# Patient Record
Sex: Female | Born: 1970 | Race: Black or African American | Hispanic: No | Marital: Single | Smoking: Never smoker
Health system: Southern US, Community
[De-identification: ages and names within clinical notes are randomized; demographics above are authoritative.]

## PROBLEM LIST (undated history)

## (undated) DIAGNOSIS — N949 Unspecified condition associated with female genital organs and menstrual cycle: Secondary | ICD-10-CM

## (undated) DIAGNOSIS — D649 Anemia, unspecified: Secondary | ICD-10-CM

## (undated) DIAGNOSIS — N859 Noninflammatory disorder of uterus, unspecified: Secondary | ICD-10-CM

## (undated) DIAGNOSIS — J45909 Unspecified asthma, uncomplicated: Secondary | ICD-10-CM

## (undated) HISTORY — PX: GALLBLADDER SURGERY: SHX652

## (undated) HISTORY — PX: ABDOMINAL HYSTERECTOMY: SHX81

## (undated) HISTORY — PX: CHOLECYSTECTOMY: SHX55

---

## 2012-12-12 DIAGNOSIS — M6281 Muscle weakness (generalized): Secondary | ICD-10-CM

## 2014-03-05 ENCOUNTER — Emergency Department (HOSPITAL_COMMUNITY): Payer: Self-pay

## 2014-03-05 ENCOUNTER — Encounter (HOSPITAL_COMMUNITY): Payer: Self-pay | Admitting: Emergency Medicine

## 2014-03-05 ENCOUNTER — Emergency Department (HOSPITAL_COMMUNITY)
Admission: EM | Admit: 2014-03-05 | Discharge: 2014-03-05 | Disposition: A | Discharge status: Home / Self Care | Payer: Self-pay | Attending: Emergency Medicine | Admitting: Emergency Medicine

## 2014-03-05 DIAGNOSIS — J45901 Unspecified asthma with (acute) exacerbation: Secondary | ICD-10-CM | POA: Insufficient documentation

## 2014-03-05 DIAGNOSIS — Z79899 Other long term (current) drug therapy: Secondary | ICD-10-CM | POA: Insufficient documentation

## 2014-03-05 DIAGNOSIS — Z862 Personal history of diseases of the blood and blood-forming organs and certain disorders involving the immune mechanism: Secondary | ICD-10-CM | POA: Insufficient documentation

## 2014-03-05 DIAGNOSIS — Z8742 Personal history of other diseases of the female genital tract: Secondary | ICD-10-CM | POA: Insufficient documentation

## 2014-03-05 HISTORY — DX: Unspecified condition associated with female genital organs and menstrual cycle: N94.9

## 2014-03-05 HISTORY — DX: Noninflammatory disorder of uterus, unspecified: N85.9

## 2014-03-05 HISTORY — DX: Unspecified asthma, uncomplicated: J45.909

## 2014-03-05 HISTORY — DX: Anemia, unspecified: D64.9

## 2014-03-05 MED ORDER — PREDNISONE 20 MG PO TABS: 60.0000 mg | ORAL_TABLET | Freq: Every day | ORAL | Status: DC

## 2014-03-05 MED ORDER — IPRATROPIUM-ALBUTEROL 0.5-2.5 (3) MG/3ML IN SOLN
3.0000 mL | Freq: Once | RESPIRATORY_TRACT | Status: AC
  Administered 2014-03-05: 3 mL via RESPIRATORY_TRACT
  Filled 2014-03-05: qty 3

## 2014-03-05 MED ORDER — ALBUTEROL SULFATE HFA 108 (90 BASE) MCG/ACT IN AERS
2.0000 | INHALATION_SPRAY | RESPIRATORY_TRACT | Status: DC | PRN
  Filled 2014-03-05: qty 6.7

## 2014-03-05 MED ORDER — PREDNISONE 50 MG PO TABS
60.0000 mg | ORAL_TABLET | Freq: Once | ORAL | Status: AC
  Administered 2014-03-05: 60 mg via ORAL
  Filled 2014-03-05 (×2): qty 1

## 2014-03-05 NOTE — Discharge Instructions (Signed)
Asthma °Asthma is a recurring condition in which the airways tighten and narrow. Asthma can make it difficult to breathe. It can cause coughing, wheezing, and shortness of breath. Asthma episodes, also called asthma attacks, range from minor to life-threatening. Asthma cannot be cured, but medicines and lifestyle changes can help control it. °CAUSES °Asthma is believed to be caused by inherited (genetic) and environmental factors, but its exact cause is unknown. Asthma may be triggered by allergens, lung infections, or irritants in the air. Asthma triggers are different for each person. Common triggers include:  °· Animal dander. °· Dust mites. °· Cockroaches. °· Pollen from trees or grass. °· Mold. °· Smoke. °· Air pollutants such as dust, household cleaners, hair sprays, aerosol sprays, paint fumes, strong chemicals, or strong odors. °· Cold air, weather changes, and winds (which increase molds and pollens in the air). °· Strong emotional expressions such as crying or laughing hard. °· Stress. °· Certain medicines (such as aspirin) or types of drugs (such as beta-blockers). °· Sulfites in foods and drinks. Foods and drinks that may contain sulfites include dried fruit, potato chips, and sparkling grape juice. °· Infections or inflammatory conditions such as the flu, a cold, or an inflammation of the nasal membranes (rhinitis). °· Gastroesophageal reflux disease (GERD). °· Exercise or strenuous activity. °SYMPTOMS °Symptoms may occur immediately after asthma is triggered or many hours later. Symptoms include: °· Wheezing. °· Excessive nighttime or early morning coughing. °· Frequent or severe coughing with a common cold. °· Chest tightness. °· Shortness of breath. °DIAGNOSIS  °The diagnosis of asthma is made by a review of your medical history and a physical exam. Tests may also be performed. These may include: °· Lung function studies. These tests show how much air you breathe in and out. °· Allergy  tests. °· Imaging tests such as X-rays. °TREATMENT  °Asthma cannot be cured, but it can usually be controlled. Treatment involves identifying and avoiding your asthma triggers. It also involves medicines. There are 2 classes of medicine used for asthma treatment:  °· Controller medicines. These prevent asthma symptoms from occurring. They are usually taken every day. °· Reliever or rescue medicines. These quickly relieve asthma symptoms. They are used as needed and provide short-term relief. °Your health care provider will help you create an asthma action plan. An asthma action plan is a written plan for managing and treating your asthma attacks. It includes a list of your asthma triggers and how they may be avoided. It also includes information on when medicines should be taken and when their dosage should be changed. An action plan may also involve the use of a device called a peak flow meter. A peak flow meter measures how well the lungs are working. It helps you monitor your condition. °HOME CARE INSTRUCTIONS  °· Take medicines only as directed by your health care provider. Speak with your health care provider if you have questions about how or when to take the medicines. °· Use a peak flow meter as directed by your health care provider. Record and keep track of readings. °· Understand and use the action plan to help minimize or stop an asthma attack without needing to seek medical care. °· Control your home environment in the following ways to help prevent asthma attacks: °¨ Do not smoke. Avoid being exposed to secondhand smoke. °¨ Change your heating and air conditioning filter regularly. °¨ Limit your use of fireplaces and wood stoves. °¨ Get rid of pests (such as roaches and   mice) and their droppings.  Throw away plants if you see mold on them.  Clean your floors and dust regularly. Use unscented cleaning products.  Try to have someone else vacuum for you regularly. Stay out of rooms while they are  being vacuumed and for a short while afterward. If you vacuum, use a dust mask from a hardware store, a double-layered or microfilter vacuum cleaner bag, or a vacuum cleaner with a HEPA filter.  Replace carpet with wood, tile, or vinyl flooring. Carpet can trap dander and dust.  Use allergy-proof pillows, mattress covers, and box spring covers.  Wash bed sheets and blankets every week in hot water and dry them in a dryer.  Use blankets that are made of polyester or cotton.  Clean bathrooms and kitchens with bleach. If possible, have someone repaint the walls in these rooms with mold-resistant paint. Keep out of the rooms that are being cleaned and painted.  Wash hands frequently. SEEK MEDICAL CARE IF:   You have wheezing, shortness of breath, or a cough even if taking medicine to prevent attacks.  The colored mucus you cough up (sputum) is thicker than usual.  Your sputum changes from clear or white to yellow, green, gray, or bloody.  You have any problems that may be related to the medicines you are taking (such as a rash, itching, swelling, or trouble breathing).  You are using a reliever medicine more than 2-3 times per week.  Your peak flow is still at 50-79% of your personal best after following your action plan for 1 hour.  You have a fever. SEEK IMMEDIATE MEDICAL CARE IF:   You seem to be getting worse and are unresponsive to treatment during an asthma attack.  You are short of breath even at rest.  You get short of breath when doing very little physical activity.  You have difficulty eating, drinking, or talking due to asthma symptoms.  You develop chest pain.  You develop a fast heartbeat.  You have a bluish color to your lips or fingernails.  You are light-headed, dizzy, or faint.  Your peak flow is less than 50% of your personal best. MAKE SURE YOU:   Understand these instructions.  Will watch your condition.  Will get help right away if you are not  doing well or get worse. Document Released: 06/24/2005 Document Revised: 11/08/2013 Document Reviewed: 01/21/2013 [facility] Patient Information 2015 [place], [place]. This information is not intended to replace advice given to you by your health care provider. Make sure you discuss any questions you have with your health care provider.  Albuterol inhalation aerosol What is this medicine? ALBUTEROL (al Normajean [place]) is a bronchodilator. It helps open up the airways in your lungs to make it easier to breathe. This medicine is used to treat and to prevent bronchospasm. This medicine may be used for other purposes; ask your health care provider or pharmacist if you have questions. COMMON BRAND NAME(S): Proair HFA, Proventil, Proventil HFA, Respirol, Ventolin, Ventolin HFA What should I tell my health care provider before I take this medicine? They need to know if you have any of the following conditions: -diabetes -heart disease or irregular heartbeat -high blood pressure -pheochromocytoma -seizures -thyroid disease -an unusual or allergic reaction to albuterol, levalbuterol, sulfites, other medicines, foods, dyes, or preservatives -pregnant or trying to get pregnant -breast-feeding How should I use this medicine? This medicine is for inhalation through the mouth. Follow the directions on your prescription label. Take your medicine at  regular intervals. Do not use more often than directed. Make sure that you are using your inhaler correctly. Ask you doctor or health care provider if you have any questions. Talk to your pediatrician regarding the use of this medicine in children. Special care may be needed. Overdosage: If you think you have taken too much of this medicine contact a poison control center or emergency room at once. NOTE: This medicine is only for you. Do not share this medicine with others. What if I miss a dose? If you miss a dose, use it as soon as you can. If it is almost time  for your next dose, use only that dose. Do not use double or extra doses. What may interact with this medicine? -anti-infectives like chloroquine and pentamidine -caffeine -cisapride -diuretics -medicines for colds -medicines for depression or for emotional or psychotic conditions -medicines for weight loss including some herbal products -methadone -some antibiotics like clarithromycin, erythromycin, levofloxacin, and linezolid -some heart medicines -steroid hormones like dexamethasone, cortisone, hydrocortisone -theophylline -thyroid hormones This list may not describe all possible interactions. Give your health care provider a list of all the medicines, herbs, non-prescription drugs, or dietary supplements you use. Also tell them if you smoke, drink alcohol, or use illegal drugs. Some items may interact with your medicine. What should I watch for while using this medicine? Tell your doctor or health care professional if your symptoms do not improve. Do not use extra albuterol. If your asthma or bronchitis gets worse while you are using this medicine, call your doctor right away. If your mouth gets dry try chewing sugarless gum or sucking hard candy. Drink water as directed. What side effects may I notice from receiving this medicine? Side effects that you should report to your doctor or health care professional as soon as possible: -allergic reactions like skin rash, itching or hives, swelling of the face, lips, or tongue -breathing problems -chest pain -feeling faint or lightheaded, falls -high blood pressure -irregular heartbeat -fever -muscle cramps or weakness -pain, tingling, numbness in the hands or feet -vomiting Side effects that usually do not require medical attention (report to your doctor or health care professional if they continue or are bothersome): -cough -difficulty sleeping -headache -nervousness or trembling -stomach upset -stuffy or runny nose -throat  irritation -unusual taste This list may not describe all possible side effects. Call your doctor for medical advice about side effects. You may report side effects to FDA at 1-800-FDA-1088. Where should I keep my medicine? Keep out of the reach of children. Store at room temperature between 15 and 30 degrees C (59 and 86 degrees F). The contents are under pressure and may burst when exposed to heat or flame. Do not freeze. This medicine does not work as well if it is too cold. Throw away any unused medicine after the expiration date. Inhalers need to be thrown away after the labeled number of puffs have been used or by the expiration date; whichever comes first. Ventolin HFA should be thrown away 12 months after removing from foil pouch. Check the instructions that come with your medicine. NOTE: This sheet is a summary. It may not cover all possible information. If you have questions about this medicine, talk to your doctor, pharmacist, or health care provider.  2015, Elsevier/Gold Standard. (2012-12-10 10:57:17)  Prednisone tablets What is this medicine? PREDNISONE (PRED ni sone) is a corticosteroid. It is commonly used to treat inflammation of the skin, joints, lungs, and other organs. Common conditions treated  include asthma, allergies, and arthritis. It is also used for other conditions, such as blood disorders and diseases of the adrenal glands. This medicine may be used for other purposes; ask your health care provider or pharmacist if you have questions. COMMON BRAND NAME(S): Deltasone, Predone, Sterapred, Sterapred DS What should I tell my health care provider before I take this medicine? They need to know if you have any of these conditions: -Cushing's syndrome -diabetes -glaucoma -heart disease -high blood pressure -infection (especially a virus infection such as chickenpox, cold sores, or herpes) -kidney disease -liver disease -mental illness -myasthenia  gravis -osteoporosis -seizures -stomach or intestine problems -thyroid disease -an unusual or allergic reaction to lactose, prednisone, other medicines, foods, dyes, or preservatives -pregnant or trying to get pregnant -breast-feeding How should I use this medicine? Take this medicine by mouth with a glass of water. Follow the directions on the prescription label. Take this medicine with food. If you are taking this medicine once a day, take it in the morning. Do not take more medicine than you are told to take. Do not suddenly stop taking your medicine because you may develop a severe reaction. Your doctor will tell you how much medicine to take. If your doctor wants you to stop the medicine, the dose may be slowly lowered over time to avoid any side effects. Talk to your pediatrician regarding the use of this medicine in children. Special care may be needed. Overdosage: If you think you have taken too much of this medicine contact a poison control center or emergency room at once. NOTE: This medicine is only for you. Do not share this medicine with others. What if I miss a dose? If you miss a dose, take it as soon as you can. If it is almost time for your next dose, talk to your doctor or health care professional. You may need to miss a dose or take an extra dose. Do not take double or extra doses without advice. What may interact with this medicine? Do not take this medicine with any of the following medications: -metyrapone -mifepristone This medicine may also interact with the following medications: -aminoglutethimide -amphotericin B -aspirin and aspirin-like medicines -barbiturates -certain medicines for diabetes, like glipizide or glyburide -cholestyramine -cholinesterase inhibitors -cyclosporine -digoxin -diuretics -ephedrine -female hormones, like estrogens and birth control pills -isoniazid -ketoconazole -NSAIDS, medicines for pain and inflammation, like ibuprofen or  naproxen -phenytoin -rifampin -toxoids -vaccines -warfarin This list may not describe all possible interactions. Give your health care provider a list of all the medicines, herbs, non-prescription drugs, or dietary supplements you use. Also tell them if you smoke, drink alcohol, or use illegal drugs. Some items may interact with your medicine. What should I watch for while using this medicine? Visit your doctor or health care professional for regular checks on your progress. If you are taking this medicine over a prolonged period, carry an identification card with your name and address, the type and dose of your medicine, and your doctor's name and address. This medicine may increase your risk of getting an infection. Tell your doctor or health care professional if you are around anyone with measles or chickenpox, or if you develop sores or blisters that do not heal properly. If you are going to have surgery, tell your doctor or health care professional that you have taken this medicine within the last twelve months. Ask your doctor or health care professional about your diet. You may need to lower the amount of salt you  eat. This medicine may affect blood sugar levels. If you have diabetes, check with your doctor or health care professional before you change your diet or the dose of your diabetic medicine. What side effects may I notice from receiving this medicine? Side effects that you should report to your doctor or health care professional as soon as possible: -allergic reactions like skin rash, itching or hives, swelling of the face, lips, or tongue -changes in emotions or moods -changes in vision -depressed mood -eye pain -fever or chills, cough, sore throat, pain or difficulty passing urine -increased thirst -swelling of ankles, feet Side effects that usually do not require medical attention (report to your doctor or health care professional if they continue or are  bothersome): -confusion, excitement, restlessness -headache -nausea, vomiting -skin problems, acne, thin and shiny skin -trouble sleeping -weight gain This list may not describe all possible side effects. Call your doctor for medical advice about side effects. You may report side effects to FDA at 1-800-FDA-1088. Where should I keep my medicine? Keep out of the reach of children. Store at room temperature between 15 and 30 degrees C (59 and 86 degrees F). Protect from light. Keep container tightly closed. Throw away any unused medicine after the expiration date. NOTE: This sheet is a summary. It may not cover all possible information. If you have questions about this medicine, talk to your doctor, pharmacist, or health care provider.  2015, Elsevier/Gold Standard. (2011-02-07 10:57:14)   Emergency Department Resource Guide 1) Find a Doctor and Pay Out of Pocket Although you won't have to find out who is covered by your insurance plan, it is a good idea to ask around and get recommendations. You will then need to call the office and see if the doctor you have chosen will accept you as a new patient and what types of options they offer for patients who are self-pay. Some doctors offer discounts or will set up payment plans for their patients who do not have insurance, but you will need to ask so you aren't surprised when you get to your appointment.  2) Contact Your Local Health Department Not all health departments have doctors that can see patients for sick visits, but many do, so it is worth a call to see if yours does. If you don't know where your local health department is, you can check in your phone book. The CDC also has a tool to help you locate your state's health department, and many state websites also have listings of all of their local health departments.  3) Find a Walk-in Clinic If your illness is not likely to be very severe or complicated, you may want to try a walk in clinic.  These are popping up all over the country in pharmacies, drugstores, and shopping centers. They're usually staffed by nurse practitioners or physician assistants that have been trained to treat common illnesses and complaints. They're usually fairly quick and inexpensive. However, if you have serious medical issues or chronic medical problems, these are probably not your best option.  No Primary Care Doctor: - Call Health Connect at  [phone] - they can help you locate a primary care doctor that  accepts your insurance, provides certain services, etc. - Physician Referral Service- [phone]  Chronic Pain Problems: Organization         Address  Phone   Notes  [facility]  [phone] Patients need to be referred by their primary care doctor.   Medication  Assistance: Organization         Address  Phone   Notes  [facility] Medication Assistance Program [address] [place], [place] [address] [phone] --Must be a resident of [facility] -- Must have NO insurance coverage whatsoever (no Medicaid/ Medicare, etc.) -- The pt. MUST have a primary care doctor that directs their care regularly and follows them in the community   [facility]  [phone]   [employer]  [phone]    Agencies that provide inexpensive medical care: Organization         Address  Phone   Notes  [facility]  [phone]   [facility]    [phone]   [facility] [address] [place], [place] [address] [phone]   [facility] [address] [place] [phone]   [facility]anned Parenthood    [phone]   [facility]    [phone]   [facility]  [address], [place] Phone:  [phone], Fax:  [phone] Hours of Operation:  9 am - 6 pm, M-F.  Also accepts Medicaid/Medicare and self-pay.  [facility]  [address], [place] Phone: [phone], Fax: [phone]. Hours of Operation:  8:30 am - 5:30 pm, M-F.  Also accepts Medicaid and self-pay.  [facility] [place] [address], [place] Phone: [phone]   [facility] [address] [place], [place] [phone], Ext. [phone] Mondays & Thursdays: 7-9 AM.  First 15 patients are seen on a first come, first serve basis.    Medicaid-accepting Community [facility] Providers:  Organization         Address  Phone   Notes  [facility] [address],  [phone] Also accepts self-pay patients.  [facility] [address] Laurell Josephs [place], [place]  [phone]   [facility] [address], [place] [phone]   [facility] [address] [place] [phone]   Renaye Rakers [address], [place]   [phone] Only accepts [place] patients after they have their name applied to their card.   Self-Pay (no insurance) in [facility]:  Organization         Address  Phone   Notes  Sickle Cell Patients, [facility] [address] [place], [place] [phone]   [facility] [address] [place], [place] [phone]   [facility]  [address], [place] [phone]   [facility]/Dr. Osei-Bonsu  [address] [place] or [address], [place] [phone] Phone number for both [place] and [place] locations is the same.  [facility] and [facility] [address], [place] [phone]   [facility] [address], [place] or [address] [phone]   [facility] [address] [place] [phone], phone; [phone], fax Sees patients 1st  and 3rd Saturday of every month.  Must not qualify for public or private insurance (i.e. Medicaid, Medicare, [place] Health Choice, Veterans' Benefits)  Household income should be no more than 200% of the poverty level The clinic  cannot treat you if you are pregnant or think you are pregnant  Sexually transmitted diseases are not treated at the clinic.    Dental Care: Organization         Address  Phone  Notes  [facility] of [facility] [address] [place], [place] [phone] Accepts children up to age 43 who are enrolled in [place] or [place] Health Choice; pregnant women with a Medicaid card; and children who have applied for Medicaid or [place] Health Choice, but were declined, whose parents can pay a reduced fee at time of service.  [facility] Department of [facility]  [address], [place] [phone] Accepts children up to age 87 who are enrolled in [place] or [place] Health Choice; pregnant women with a Medicaid card; and children who have applied for Medicaid or  Health Choice, but were declined, whose parents can pay a reduced fee at time of service.  [facility]  [address] [place], [place] [phone] Patients are seen by appointment only. Walk-ins are not accepted. [facility] will see patients 22 years of age and older. Monday - Tuesday (8am-5pm) Most Wednesdays (8:30-5pm) $30 per visit, cash only  [facility]  [address], [facility] [phone] Patients are seen by appointment only. Walk-ins are not accepted. [facility] will see patients 37 years of age and older. One Wednesday Evening (Monthly: Volunteer Based).  $30 per visit, cash only  [employer]  [phone] for adults; Children under age 74, call Graduate Pediatric Dentistry at [phone]. Children aged 76-14, please call [phone] to request a pediatric  application.  Dental services are provided in all areas of dental care including fillings, crowns and bridges, complete and partial dentures, implants, gum treatment, root canals, and extractions. Preventive care is also provided. Treatment is provided to both adults and children. Patients are selected via a lottery and there is often a waiting list.   [facility] [address], [place]  [phone] [email or web address]   [facility] [address] [place], [place] [phone], Ext. [phone] Second and Fourth Thursday of each month, opens at 6:30 AM; Clinic ends at 9 AM.  Patients are seen on a first-come first-served basis, and a limited number are seen during each clinic.   [facility]  [address] Ether Griffins [place], [place] [phone]   Eligibility Requirements You must have lived in [place], [place], or [place] counties for at least the last three months.   You cannot be eligible for state or federal sponsored [place], including [employer], [place], or [employer].   You generally cannot be eligible for healthcare insurance through your employer.    How to apply: Eligibility screenings are held every Tuesday and Wednesday afternoon from 1:00 pm until 4:00 pm. You do not need an appointment for the interview!  [facility] [address], [place], [place] [phone]   [facility] Health Department  [phone]   [facility] Health Department  [phone]   [facility] Health Department  [phone]    Behavioral Health Resources in the Community: Intensive Outpatient Programs Organization         Address  Phone  Notes  [facility] [address] [place], [place] [phone]   [facility] [address], [place], [place] [phone]  [facility] [address] [place], [place]  [phone]   [facility] Mental Health  [address]  [place], [place] [phone] or [phone]   Substance Abuse Resources Organization         Address  Phone  Notes  [facility]  [phone]   [facility]  [phone]   [facility]  [phone]   Floydene Flock  [phone]   Residential & Outpatient Substance Abuse Program  [phone]   Psychological Services Organization         Address  Phone  Notes  [facility] Behavioral Health  [phone]   [facility]  [phone]   [facility] Mental Health [address], [place] [phone] or [phone]    Mobile Crisis Teams Organization         Address  Phone  Notes  [facility], Mobile Crisis Care Unit  [phone]   [facility]  [address] [place], [place] [phone]   Doristine Locks [address] [place] [phone]    Self-Help/Support Groups Organization         Address  Phone             Notes  [facility] [place] - variety of support groups  [phone]- [phone] Call for more information  Narcotics Anonymous (NA), [facility] [address], [employer] [place]  2 meetings at this location   Statistician         Address  Phone  Notes  [facility] [address],    [place]  [phone]   [facility]  [address] [place] [address], [place], [place] [phone]   [facility] [address] [place], [place] [phone] Admissions: 8am-3pm M-F  [facility] [address],    [place], [place] [phone]   [facility] [address] [place], [place], [place] [phone]   [facility] [address],  [place], [place] [phone]   [facility] - Intensive Outpatient [address], [place], [place] [phone]   [facility] ([facility] [address] [place]   [place], [place] [phone] or [phone]   [facility] ([facility] [address], [place], [place] [phone] Accepts Medicaid  [facility] [address],  [place] [phone] Substance Abuse/Addiction Treatment   [facility] Organization         Address  Phone  Notes  [facility]  [phone]   Angie Fava, PhD [address] [place]vin Knack [place], [place]   [phone] or [phone]   [facility] Behavioral   [address] [place], [place] [phone]   [facility] [address], [place], [place] [phone] Insurance/Medicaid/sponsorship through [facility] and Families [address]                                    [place], [place] [phone] Therapy/tele-psych/case  [facility] [address], [place] [phone]    Dr. Lolly Mustache  [phone]   [facility]  [employer] [facility] Dept. 1) [address] [place] 2) [address] [place] 3)  [address], [place] [phone]  [phone]  [place] [phone] or [phone] (After Hours)

## 2014-03-05 NOTE — ED Provider Notes (Signed)
CSN: [id number]     Arrival date & time 03/05/14  0347 History   First MD Initiated Contact with Patient 03/05/14 0403     Chief Complaint  Patient presents with  . Asthma    tightness in chest from asthma for one week, inhaler is not helping     (Consider location/radiation/quality/duration/timing/severity/associated sxs/prior Treatment) Patient is a 43 y.o. female presenting with asthma. The history is provided by the patient.  Asthma  She has been having a tight feeling in her chest for last 4 days which is getting worse. Has been a cough which is nonproductive. She denies fever, chills, sweats. She has been using albuterol which initially gave relief but now is not helping. She denies nausea, vomiting, diarrhea. She denies arthralgias or myalgias. She is a nonsmoker.  Past Medical History  Diagnosis Date  . Asthma   . Endometrial disorder   . Anemia    History reviewed. No pertinent past surgical history. History reviewed. No pertinent family history. History  Substance Use Topics  . Smoking status: Never Smoker   . Smokeless tobacco: Not on file  . Alcohol Use: No   OB History   Grav Para Term Preterm Abortions TAB SAB Ect Mult Living                 Review of Systems  All other systems reviewed and are negative.     Allergies  Review of patient's allergies indicates no known allergies.  Home Medications   Prior to Admission medications   Medication Sig Start Date End Date Taking? Authorizing Provider  albuterol (PROVENTIL) (2.5 MG/3ML) 0.083% nebulizer solution Take 2.5 mg by nebulization every 6 (six) hours as needed for wheezing or shortness of breath.   Yes Historical Provider, MD   BP 148/97  Pulse 108  Temp(Src) 97.9 F (36.6 C) (Oral)  Resp 20  Ht  (1.651 m)  Wt 273 lb (123.832 kg)  BMI 45.43 kg/m2  SpO2 100%  LMP 03/05/2014 Physical Exam  Nursing note and vitals reviewed.  43 year old female, resting comfortably and in no acute  distress. Vital signs are significant for tachycardia and hypertension. Oxygen saturation is 100%, which is normal. Head is normocephalic and atraumatic. PERRLA, EOMI. Oropharynx is clear. Neck is nontender and supple without adenopathy or JVD. Back is nontender and there is no CVA tenderness. Lungs have a prolonged exhalation phase with wheezes noted on forced expiration. There are no rales or rhonchi. Chest is nontender. Heart has regular rate and rhythm without murmur. Abdomen is soft, flat, nontender without masses or hepatosplenomegaly and peristalsis is normoactive. Extremities have no cyanosis or edema, full range of motion is present. Skin is warm and dry without rash. Neurologic: Mental status is normal, cranial nerves are intact, there are no motor or sensory deficits.  ED Course  Procedures (including critical care time)  Imaging Review Dg Chest 2 View  03/05/2014   CLINICAL DATA:  Asthma and shortness of breath for 1 week.  EXAM: CHEST  2 VIEW  COMPARISON:  None.  FINDINGS: The heart size and mediastinal contours are within normal limits. Both lungs are clear. The visualized skeletal structures are unremarkable.  IMPRESSION: No active cardiopulmonary disease.   Electronically Signed   By: Burman Nieves M.D.   On: 03/05/2014 05:00   MDM   Final diagnoses:  Asthma exacerbation    Exacerbation of asthma. Chest x-ray will be obtained to rule out pneumonia. She'll be given an albuterol with  ipratropium nebulizer treatment and will be given initial dose of prednisone. She has no prior records and the current health system.  She had excellent symptomatically for the albuterol with ipratropium. X-ray shows no evidence of pneumonia. She admits that her inhaler stop working because it is running low. She is discharged with prescription for prednisone and was given albuterol inhaler to use at home as needed.  Dione Booze, MD 03/05/14 [phone]

## 2014-03-10 ENCOUNTER — Encounter (HOSPITAL_COMMUNITY): Payer: Self-pay | Admitting: Emergency Medicine

## 2014-03-10 ENCOUNTER — Emergency Department (HOSPITAL_COMMUNITY)
Admission: EM | Admit: 2014-03-10 | Discharge: 2014-03-10 | Disposition: A | Discharge status: Home / Self Care | Payer: Self-pay | Attending: Emergency Medicine | Admitting: Emergency Medicine

## 2014-03-10 DIAGNOSIS — J45901 Unspecified asthma with (acute) exacerbation: Secondary | ICD-10-CM | POA: Insufficient documentation

## 2014-03-10 DIAGNOSIS — J45909 Unspecified asthma, uncomplicated: Secondary | ICD-10-CM | POA: Insufficient documentation

## 2014-03-10 DIAGNOSIS — IMO0002 Reserved for concepts with insufficient information to code with codable children: Secondary | ICD-10-CM | POA: Insufficient documentation

## 2014-03-10 DIAGNOSIS — Z862 Personal history of diseases of the blood and blood-forming organs and certain disorders involving the immune mechanism: Secondary | ICD-10-CM | POA: Insufficient documentation

## 2014-03-10 DIAGNOSIS — Z8742 Personal history of other diseases of the female genital tract: Secondary | ICD-10-CM | POA: Insufficient documentation

## 2014-03-10 MED ORDER — ALBUTEROL SULFATE (2.5 MG/3ML) 0.083% IN NEBU
2.5000 mg | INHALATION_SOLUTION | Freq: Once | RESPIRATORY_TRACT | Status: AC
  Administered 2014-03-10: 2.5 mg via RESPIRATORY_TRACT
  Filled 2014-03-10: qty 3

## 2014-03-10 MED ORDER — IPRATROPIUM-ALBUTEROL 0.5-2.5 (3) MG/3ML IN SOLN
3.0000 mL | Freq: Once | RESPIRATORY_TRACT | Status: AC
  Administered 2014-03-10: 3 mL via RESPIRATORY_TRACT
  Filled 2014-03-10: qty 3

## 2014-03-10 MED ORDER — ALBUTEROL SULFATE HFA 108 (90 BASE) MCG/ACT IN AERS: 1.0000 | INHALATION_SPRAY | RESPIRATORY_TRACT | Status: AC | PRN

## 2014-03-10 NOTE — ED Provider Notes (Signed)
CSN: [id number]     Arrival date & time 03/10/14  0148 History   First MD Initiated Contact with Patient 03/10/14 0205     Chief Complaint  Patient presents with  . Asthma      Patient is a 43 y.o. female presenting with asthma. The history is provided by the patient.  Asthma This is a recurrent problem. The current episode started more than 1 week ago. The problem occurs daily. The problem has been gradually improving. Associated symptoms include shortness of breath. Pertinent negatives include no abdominal pain. Associated symptoms comments: Chest tightness. Nothing aggravates the symptoms. The symptoms are relieved by medications. Treatments tried: albuterol. The treatment provided mild relief.  Pt reports she has been dealing with an asthma exacerbation for past 2 weeks She was seen on 8/29 for asthma, felt improved after treatment and went home She reports she had some improvement but tonight she had another exacerbation that is now improving She reports the prednisone is causing a rash She is a nonsmoker Pt denies h/o CAD/DVT/PE No hemoptysis No new LE edema/pain She denies h/o intubation for asthma   Past Medical History  Diagnosis Date  . Asthma   . Endometrial disorder   . Anemia    Past Surgical History  Procedure Laterality Date  . Cholecystectomy     No family history on file. History  Substance Use Topics  . Smoking status: Never Smoker   . Smokeless tobacco: Not on file  . Alcohol Use: No   OB History   Grav Para Term Preterm Abortions TAB SAB Ect Mult Living                 Review of Systems  Constitutional: Negative for fever.  Respiratory: Positive for shortness of breath.   Cardiovascular: Negative for leg swelling.       Chest tightness   Gastrointestinal: Negative for abdominal pain.  All other systems reviewed and are negative.     Allergies  Review of patient's allergies indicates no known allergies.  Home Medications   Prior to  Admission medications   Medication Sig Start Date End Date Taking? Authorizing Provider  albuterol (PROVENTIL) (2.5 MG/3ML) 0.083% nebulizer solution Take 2.5 mg by nebulization every 6 (six) hours as needed for wheezing or shortness of breath.   Yes Historical Provider, MD  predniSONE (DELTASONE) 20 MG tablet Take 3 tablets (60 mg total) by mouth daily. 03/05/14   Dione Booze, MD   BP 149/70  Pulse 70  Resp 20  Ht  (1.651 m)  Wt 273 lb (123.832 kg)  BMI 45.43 kg/m2  SpO2 100%  LMP 03/05/2014 Physical Exam CONSTITUTIONAL: Well developed/well nourished HEAD: Normocephalic/atraumatic EYES: EOMI/PERRL ENMT: Mucous membranes moist NECK: supple no meningeal signs SPINE:entire spine nontender CV: S1/S2 noted, no murmurs/rubs/gallops noted LUNGS: scattered wheeze noted with decreased BS noted bilaterally, no apparent distress ABDOMEN: soft, nontender, no rebound or guarding GU:no cva tenderness NEURO: Pt is awake/alert, moves all extremitiesx4 EXTREMITIES: pulses normal, full ROM, no LE tenderness SKIN: warm, color normal PSYCH: no abnormalities of mood noted  ED Course  Procedures  2:27 AM Pt with recent ED visit for asthma exacerbation.  She reports she came back as she felt as though she was worsening tonight, though now improved Will give a round of albuterol/atrovent Will not repeat CXR She will stop her prednisone I doubt ACS/PE/CHF at this time  Pt improved after nebs She feels comfortable for d/c home We discussed strict return precautions  EKG Interpretation   Date/Time:  Thursday March 10 2014 02:22:19 EDT Ventricular Rate:  61 PR Interval:  141 QRS Duration: 78 QT Interval:  414 QTC Calculation: 417 R Axis:   7 Text Interpretation:  Sinus rhythm Low voltage, precordial leads T wave  inversion in lead III Abnormal ekg No previous ECGs available Confirmed by  Bebe Shaggy  MD, Dorinda Hill ([id number]) on 03/10/2014 2:28:54 AM      MDM   Final diagnoses:  Asthma  exacerbation    Nursing notes including past medical history and social history reviewed and considered in documentation Previous records reviewed and considered    Joya Gaskins, MD 03/10/14 0302

## 2014-03-10 NOTE — ED Notes (Signed)
Patient c/o asthma symptoms x 2 weeks; states was seen here a few nights ago and given Prednisone and nebulizers.  Patient states she is still having cough and wheezing.

## 2014-03-10 NOTE — Discharge Instructions (Signed)

## 2016-09-11 DIAGNOSIS — N390 Urinary tract infection, site not specified: Secondary | ICD-10-CM | POA: Diagnosis not present

## 2016-09-30 DIAGNOSIS — J4520 Mild intermittent asthma, uncomplicated: Secondary | ICD-10-CM | POA: Diagnosis not present

## 2016-09-30 DIAGNOSIS — Z9189 Other specified personal risk factors, not elsewhere classified: Secondary | ICD-10-CM | POA: Diagnosis not present

## 2017-01-09 DIAGNOSIS — Z9189 Other specified personal risk factors, not elsewhere classified: Secondary | ICD-10-CM | POA: Diagnosis not present

## 2017-01-09 DIAGNOSIS — J4520 Mild intermittent asthma, uncomplicated: Secondary | ICD-10-CM | POA: Diagnosis not present

## 2017-03-13 DIAGNOSIS — J4520 Mild intermittent asthma, uncomplicated: Secondary | ICD-10-CM | POA: Diagnosis not present

## 2017-03-13 DIAGNOSIS — J309 Allergic rhinitis, unspecified: Secondary | ICD-10-CM | POA: Diagnosis not present

## 2017-03-13 DIAGNOSIS — H6991 Unspecified Eustachian tube disorder, right ear: Secondary | ICD-10-CM | POA: Diagnosis not present

## 2017-06-17 DIAGNOSIS — J4520 Mild intermittent asthma, uncomplicated: Secondary | ICD-10-CM | POA: Diagnosis not present

## 2017-06-17 DIAGNOSIS — K439 Ventral hernia without obstruction or gangrene: Secondary | ICD-10-CM | POA: Diagnosis not present

## 2017-06-19 DIAGNOSIS — R112 Nausea with vomiting, unspecified: Secondary | ICD-10-CM | POA: Diagnosis not present

## 2017-06-19 DIAGNOSIS — R509 Fever, unspecified: Secondary | ICD-10-CM | POA: Diagnosis not present

## 2017-06-19 DIAGNOSIS — L02211 Cutaneous abscess of abdominal wall: Secondary | ICD-10-CM | POA: Diagnosis not present

## 2020-05-01 ENCOUNTER — Emergency Department (HOSPITAL_COMMUNITY)
Admission: EM | Admit: 2020-05-01 | Discharge: 2020-05-01 | Disposition: A | Discharge status: Home / Self Care | Payer: 59 | Attending: Emergency Medicine | Admitting: Emergency Medicine

## 2020-05-01 ENCOUNTER — Encounter (HOSPITAL_COMMUNITY): Payer: Self-pay | Admitting: Emergency Medicine

## 2020-05-01 ENCOUNTER — Emergency Department (HOSPITAL_COMMUNITY): Payer: 59

## 2020-05-01 DIAGNOSIS — Z91013 Allergy to seafood: Secondary | ICD-10-CM | POA: Insufficient documentation

## 2020-05-01 DIAGNOSIS — J45909 Unspecified asthma, uncomplicated: Secondary | ICD-10-CM | POA: Insufficient documentation

## 2020-05-01 DIAGNOSIS — R0602 Shortness of breath: Secondary | ICD-10-CM | POA: Insufficient documentation

## 2020-05-01 DIAGNOSIS — R062 Wheezing: Secondary | ICD-10-CM | POA: Diagnosis present

## 2020-05-01 LAB — CBC WITH DIFFERENTIAL/PLATELET
Abs Immature Granulocytes: 0.12 10*3/uL — ABNORMAL HIGH (ref 0.00–0.07)
Basophils Absolute: 0.1 10*3/uL (ref 0.0–0.1)
Basophils Relative: 0 %
Eosinophils Absolute: 0.1 10*3/uL (ref 0.0–0.5)
Eosinophils Relative: 1 %
HCT: 40.4 % (ref 36.0–46.0)
Hemoglobin: 13 g/dL (ref 12.0–15.0)
Immature Granulocytes: 1 %
Lymphocytes Relative: 24 %
Lymphs Abs: 3.4 10*3/uL (ref 0.7–4.0)
MCH: 28 pg (ref 26.0–34.0)
MCHC: 32.2 g/dL (ref 30.0–36.0)
MCV: 86.9 fL (ref 80.0–100.0)
Monocytes Absolute: 1.3 10*3/uL — ABNORMAL HIGH (ref 0.1–1.0)
Monocytes Relative: 9 %
Neutro Abs: 9.4 10*3/uL — ABNORMAL HIGH (ref 1.7–7.7)
Neutrophils Relative %: 65 %
Platelets: 407 10*3/uL — ABNORMAL HIGH (ref 150–400)
RBC: 4.65 MIL/uL (ref 3.87–5.11)
RDW: 14.1 % (ref 11.5–15.5)
WBC: 14.3 10*3/uL — ABNORMAL HIGH (ref 4.0–10.5)
nRBC: 0 % (ref 0.0–0.2)

## 2020-05-01 LAB — BASIC METABOLIC PANEL
Anion gap: 12 (ref 5–15)
BUN: 7 mg/dL (ref 6–20)
CO2: 21 mmol/L — ABNORMAL LOW (ref 22–32)
Calcium: 9.2 mg/dL (ref 8.9–10.3)
Chloride: 105 mmol/L (ref 98–111)
Creatinine, Ser: 0.78 mg/dL (ref 0.44–1.00)
GFR, Estimated: >60 mL/min (ref >60)
Glucose, Bld: 123 mg/dL — ABNORMAL HIGH (ref 70–99)
Potassium: 3.5 mmol/L (ref 3.5–5.1)
Sodium: 138 mmol/L (ref 135–145)

## 2020-05-01 MED ORDER — PREDNISONE 10 MG PO TABS: 20.0000 mg | ORAL_TABLET | Freq: Two times a day (BID) | ORAL | 0 refills | Status: AC

## 2020-05-01 NOTE — ED Notes (Signed)
Pt can ambulate without difficulty
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
## 2020-05-01 NOTE — Discharge Instructions (Addendum)
Please use your home albuterol inhaler (2 puffs) every 4 hours for the next 24 hours, and then continue to use inhaler as needed. Please take prednisone 20 mg two times daily for 5 days. Please follow up with PCP in clinic for re-evaluation!
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
## 2020-07-30 ENCOUNTER — Encounter (HOSPITAL_COMMUNITY): Payer: Self-pay

## 2020-07-30 ENCOUNTER — Emergency Department (HOSPITAL_COMMUNITY)
Admission: EM | Admit: 2020-07-30 | Discharge: 2020-07-30 | Disposition: A | Discharge status: Home / Self Care | Payer: No Typology Code available for payment source | Attending: Emergency Medicine | Admitting: Emergency Medicine

## 2020-07-30 ENCOUNTER — Other Ambulatory Visit: Payer: Self-pay

## 2020-07-30 DIAGNOSIS — S51812A Laceration without foreign body of left forearm, initial encounter: Secondary | ICD-10-CM | POA: Diagnosis present

## 2020-07-30 DIAGNOSIS — Z23 Encounter for immunization: Secondary | ICD-10-CM | POA: Diagnosis not present

## 2020-07-30 DIAGNOSIS — Z79899 Other long term (current) drug therapy: Secondary | ICD-10-CM | POA: Diagnosis not present

## 2020-07-30 DIAGNOSIS — J45909 Unspecified asthma, uncomplicated: Secondary | ICD-10-CM | POA: Diagnosis not present

## 2020-07-30 DIAGNOSIS — Y990 Civilian activity done for income or pay: Secondary | ICD-10-CM | POA: Diagnosis not present

## 2020-07-30 DIAGNOSIS — Y29XXXA Contact with blunt object, undetermined intent, initial encounter: Secondary | ICD-10-CM | POA: Diagnosis not present

## 2020-07-30 MED ORDER — POVIDONE-IODINE 10 % EX SOLN
CUTANEOUS | Status: AC
  Filled 2020-07-30: qty 15

## 2020-07-30 MED ORDER — TETANUS-DIPHTH-ACELL PERTUSSIS 5-2.5-18.5 LF-MCG/0.5 IM SUSY
0.5000 mL | PREFILLED_SYRINGE | Freq: Once | INTRAMUSCULAR | Status: AC
  Administered 2020-07-30: 0.5 mL via INTRAMUSCULAR
  Filled 2020-07-30: qty 0.5

## 2020-07-30 MED ORDER — LIDOCAINE HCL (PF) 1 % IJ SOLN
INTRAMUSCULAR | Status: AC
  Administered 2020-07-30: 5 mL
  Filled 2020-07-30: qty 5

## 2020-07-30 NOTE — ED Triage Notes (Signed)
Pt to er, pt states that she was at work and cut her arm on her locker, pt has aprox  Inch lac to her L forearm, bleeding is stopped at this time.

## 2020-07-30 NOTE — Discharge Instructions (Signed)
Keep the wound clean with mild soap and water and keep it bandaged. Sutures out in 8 days. Your primary care provider may remove the sutures for you or you may return here for suture removal. Return here for any signs of infection such as pain, surrounding redness or fever or chills.

## 2020-07-30 NOTE — ED Provider Notes (Signed)
[facility] EMERGENCY DEPARTMENT Provider Note   CSN: [id number] Arrival date & time: 07/30/20  1045     History Chief Complaint  Patient presents with  . Laceration    Nicole Gomez is a 50 y.o. female.  HPI      Nicole Gomez is a 50 y.o. female who presents to the Emergency Department complaining of work-related injury that occurred this morning. She states that she accidentally cut her left forearm on her metal locker. She states she was required by her employer to come to the emergency room for evaluation. She denies any numbness, swelling, excessive bleeding or pain to her forearm. Last tetanus is unknown.    Past Medical History:  Diagnosis Date  . Anemia   . Asthma   . Endometrial disorder     There are no problems to display for this patient.   Past Surgical History:  Procedure Laterality Date  . CHOLECYSTECTOMY       OB History   No obstetric history on file.     History reviewed. No pertinent family history.  Social History   Tobacco Use  . Smoking status: Never Smoker  . Smokeless tobacco: Never Used  Vaping Use  . Vaping Use: Never used  Substance Use Topics  . Alcohol use: No  . Drug use: No    Home Medications Prior to Admission medications   Medication Sig Start Date End Date Taking? Authorizing Provider  albuterol (PROVENTIL HFA;VENTOLIN HFA) 108 (90 BASE) MCG/ACT inhaler Inhale 1-2 puffs into the lungs every 4 (four) hours as needed for wheezing or shortness of breath. 03/10/14   Zadie Rhine, MD  albuterol (PROVENTIL) (2.5 MG/3ML) 0.083% nebulizer solution Take 2.5 mg by nebulization every 6 (six) hours as needed for wheezing or shortness of breath.    [provider]    Allergies    Patient has no known allergies.  Review of Systems   Review of Systems  Constitutional: Negative for chills and fever.  Musculoskeletal: Negative for arthralgias and joint swelling.  Skin: Positive for wound.       Laceration left  forearm  Neurological: Negative for dizziness, weakness and numbness.  Hematological: Does not bruise/bleed easily.    Physical Exam Updated Vital Signs BP (!) 161/92 (BP Location: Right Arm)   Pulse (!) 118   Temp 98.4 F (36.9 C) (Oral)   Resp 18   Ht 5\' 4"  (1.626 m)   Wt 98 kg   SpO2 100%   BMI 37.08 kg/m   Physical Exam Vitals and nursing note reviewed.  Constitutional:      General: She is not in acute distress.    Appearance: Normal appearance. She is well-developed and well-nourished.  HENT:     Head: Normocephalic and atraumatic.  Cardiovascular:     Rate and Rhythm: Normal rate and regular rhythm.     Pulses: Normal pulses and intact distal pulses.  Pulmonary:     Effort: Pulmonary effort is normal. No respiratory distress.     Breath sounds: Normal breath sounds.  Musculoskeletal:        General: No tenderness or edema. Normal range of motion.     Comments: Patient has full range of motion of the fingers of the left hand and left elbow without pain  Skin:    General: Skin is warm.     Capillary Refill: Capillary refill takes less than 2 seconds.     Findings: Laceration present.     Comments: 3  cm laceration to the mid left forearm. Bleeding controlled. No foreign body seen. No edema.  Neurological:     Mental Status: She is alert and oriented to person, place, and time.     Motor: No abnormal muscle tone.     Coordination: Coordination normal.     ED Results / Procedures / Treatments   Labs (all labs ordered are listed, but only abnormal results are displayed) Labs Reviewed - No data to display  EKG None  Radiology No results found.  Procedures Procedures (including critical care time)   LACERATION REPAIR Performed by: Laquonda Welby Authorized by: Saharra Santo Consent: Verbal consent obtained. Risks and benefits: risks, benefits and alternatives were discussed Consent given by: patient Patient identity confirmed: provided demographic  data Prepped and Draped in normal sterile fashion Wound explored  Laceration Location: Left forearm  Laceration Length: 3 cm  No Foreign Bodies seen or palpated  Anesthesia: local infiltration  Local anesthetic: lidocaine 1% without epinephrine  Anesthetic total: 3 ml  Irrigation method: syringe Amount of cleaning: standard  Skin closure: 4-0 Ethilon  Number of sutures: 8  Technique: Simple interrupted  Patient tolerance: Patient tolerated the procedure well with no immediate complications.  Medications Ordered in ED Medications  Tdap (BOOSTRIX) injection 0.5 mL (has no administration in time range)  lidocaine (PF) (XYLOCAINE) 1 % injection (5 mLs  Given 07/30/20 1206)  povidone-iodine (BETADINE) 10 % external solution (  Given 07/30/20 1206)    ED Course  I have reviewed the triage vital signs and the nursing notes.  Pertinent labs & imaging results that were available during my care of the patient were reviewed by me and considered in my medical decision making (see chart for details).    MDM Rules/Calculators/A&P                          Patient here with work-related laceration of her left forearm. No active bleeding. Neurovascularly intact. Td updated. Wound edges approximated well. Patient agrees to wound care instructions and sutures out in 8 days. Return precautions discussed.  Final Clinical Impression(s) / ED Diagnoses Final diagnoses:  Laceration of left forearm, initial encounter    Rx / DC Orders ED Discharge Orders    None       Pauline Aus, PA-C 07/30/20 1409    Pricilla Loveless, MD 08/01/20 [phone]

## 2022-02-28 IMAGING — DX DG CHEST 1V PORT
1 series · 1 of 1 positions shown · non-contrast
Comparison: 03/05/2014

CLINICAL DATA: Chest pain and shortness of breath.

EXAM:
PORTABLE CHEST 1 VIEW

[chest ap]
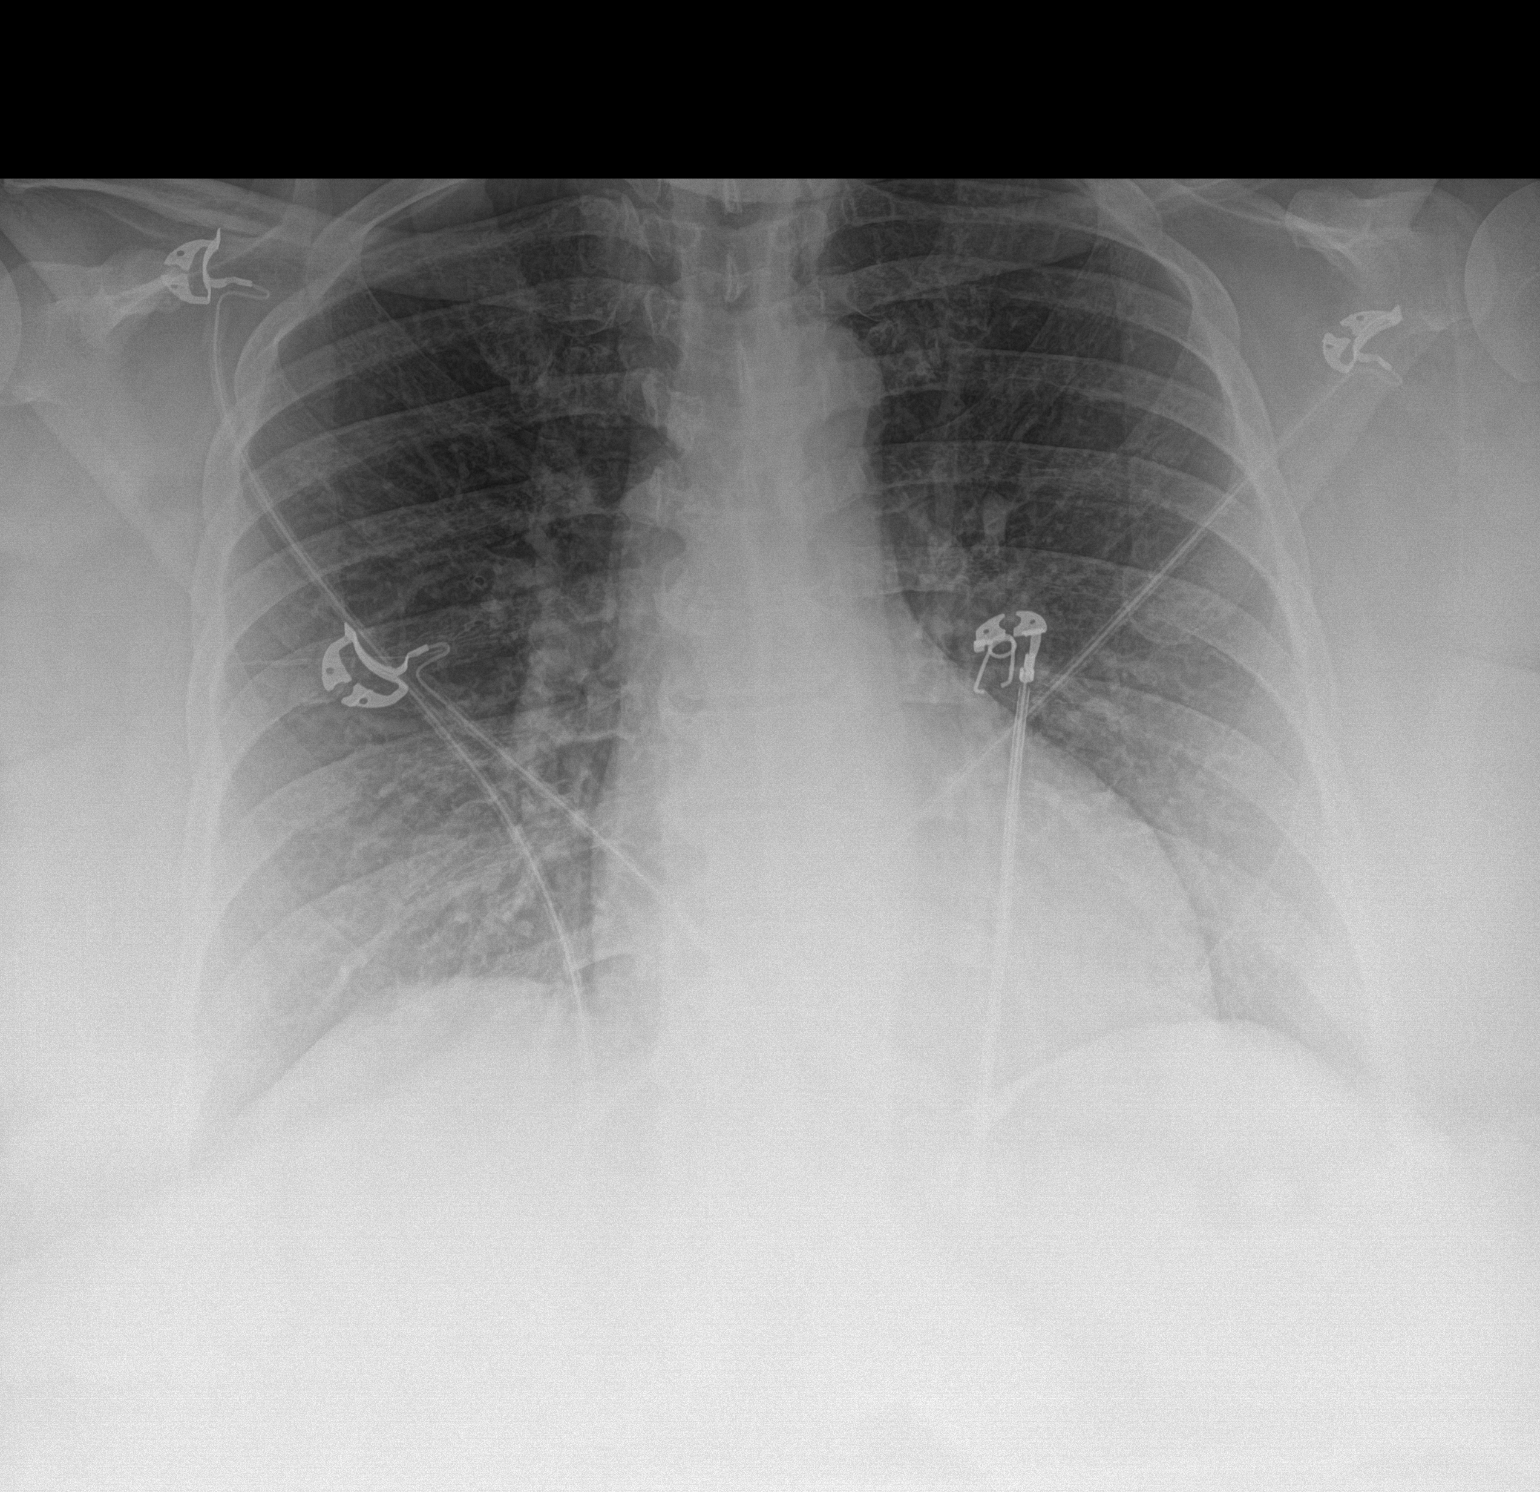

[1 of 1 positions shown; findings below may reference images not displayed]

FINDINGS: The cardiac silhouette, mediastinal and hilar contours are within
normal limits given the AP projection and portable technique. The
lungs are clear. No pleural effusion. No pulmonary lesions. The bony
thorax is intact.
IMPRESSION: No acute cardiopulmonary findings.

## 2022-03-26 LAB — TSH: TSH: 0.01 — AB (ref 0.41–5.90)

## 2022-04-09 ENCOUNTER — Other Ambulatory Visit (HOSPITAL_COMMUNITY): Payer: Self-pay | Admitting: Family Medicine

## 2022-04-09 DIAGNOSIS — E0590 Thyrotoxicosis, unspecified without thyrotoxic crisis or storm: Secondary | ICD-10-CM

## 2022-04-16 ENCOUNTER — Ambulatory Visit (HOSPITAL_COMMUNITY)
Admission: RE | Admit: 2022-04-16 | Discharge: 2022-04-16 | Disposition: A | Discharge status: Home / Self Care | Payer: No Typology Code available for payment source | Source: Ambulatory Visit | Attending: Family Medicine | Admitting: Family Medicine

## 2022-04-16 ENCOUNTER — Encounter (HOSPITAL_COMMUNITY): Payer: Self-pay

## 2022-04-16 ENCOUNTER — Encounter (HOSPITAL_COMMUNITY)
Admission: RE | Admit: 2022-04-16 | Discharge: 2022-04-16 | Disposition: A | Discharge status: Home / Self Care | Payer: No Typology Code available for payment source | Source: Ambulatory Visit | Attending: Family Medicine | Admitting: Family Medicine

## 2022-04-16 DIAGNOSIS — E0590 Thyrotoxicosis, unspecified without thyrotoxic crisis or storm: Secondary | ICD-10-CM

## 2022-04-16 MED ORDER — SODIUM IODIDE I-123 7.4 MBQ CAPS
300.0000 | ORAL_CAPSULE | Freq: Once | ORAL | Status: AC
  Administered 2022-04-16: 315 via ORAL

## 2022-04-17 ENCOUNTER — Encounter (HOSPITAL_COMMUNITY)
Admission: RE | Admit: 2022-04-17 | Discharge: 2022-04-17 | Disposition: A | Discharge status: Home / Self Care | Payer: No Typology Code available for payment source | Source: Ambulatory Visit | Attending: Family Medicine | Admitting: Family Medicine

## 2022-05-08 ENCOUNTER — Encounter: Payer: Self-pay | Admitting: Nurse Practitioner

## 2022-05-08 ENCOUNTER — Ambulatory Visit (INDEPENDENT_AMBULATORY_CARE_PROVIDER_SITE_OTHER): Payer: No Typology Code available for payment source | Admitting: Nurse Practitioner

## 2022-05-08 VITALS — BP 139/77 | Temp 91.0°F | Ht 65.0 in | Wt 328.8 lb

## 2022-05-08 DIAGNOSIS — E0510 Thyrotoxicosis with toxic single thyroid nodule without thyrotoxic crisis or storm: Secondary | ICD-10-CM | POA: Diagnosis not present

## 2022-05-08 NOTE — Patient Instructions (Signed)
Radioiodine (I-131) Therapy for Hyperthyroidism  Radioiodine (I-131) therapy is a treatment for an overactive thyroid gland (hyperthyroidism). The thyroid is a gland in the neck that makes hormones that affect how the body processes food for energy (metabolism) and how the heart and brain function. This treatment involves swallowing a pill or liquid that contains I-131. I-131 is manufactured (synthetic) iodine that gives off radiation. After it is swallowed, the I-131 will be absorbed by the thyroid gland over the next few months. It will destroy thyroid cells and reverse hyperthyroidism. Tell a health care provider about: Any allergies you have. All medicines you are taking, including vitamins, herbs, eye drops, creams, and over-the-counter medicines. Any bleeding problems you have. Any surgeries you have had. Any medical conditions you have. Whether you are pregnant, may be pregnant, or have gone through menopause. The health care provider will also want to know: Whether you are breastfeeding. Whether you plan to have children in the next 2 years. Any contact you have with children or pregnant women. Your travel plans for the next 3 months. Whether you pass through radiation detectors for work or travel. What are the risks? Generally, this is a safe procedure. However, problems may occur, including: Damage to other structures or organs, such as the salivary glands. This could lead to dry mouth and loss of taste. Low sperm count. This may lead to temporary infertility. Sore throat or neck pain. This is temporary. Slightly increased risk of thyroid cancer. Nausea or vomiting. Hypothyroidism. This is a condition in which the thyroid gland does not make enough thyroid hormone. Radiation thyroiditis. This is the swelling of the thyroid gland that is caused by the radioiodine therapy. What happens before the procedure? When to stop eating and drinking Follow instructions from your health care  provider about what you may eat and drink. These may include: 2 hours before your procedure Stop eating all foods. Stop drinking all liquids. You may be allowed to take medicines with small sips of water. If you do not follow your health care provider's instructions, your procedure may be delayed or canceled. Medicines Ask your health care provider about: Changing or stopping your regular medicines. These include any diabetes medicines, blood thinners, or thyroid medicines. Taking over-the-counter medicines, vitamins, herbs, and supplements. General instructions Women may be asked to take a pregnancy test. Women who are breastfeeding should: Plan to stop at least 6 weeks before the procedure. Not go back to breastfeeding after the procedure until their health care provider approves. Plan to avoid contact with other people for 1 week after your treatment to protect others from exposure to radiation as it leaves your body. Avoiding contact with children and pregnant women is especially important. To do this, plan to stay home from work, arrange child care, and sleep alone. Plan to drive yourself home after treatment. Do not take public transportation. If you need someone to drive you home, sit as far away from the driver as possible. What happens during the procedure? You will be given a dose of I-131 to swallow. It may be a capsule or a liquid. Your thyroid gland will absorb the I-131 over the next 3 months. The treatment process will be complete in about 6 months. What happens after the procedure? You may need to stay in the hospital for 24 hours after your treatment. This depends on the requirements in your state. Follow instructions from your health care provider about: How to take care of yourself after the procedure. How to   protect others from exposure to radiation as it leaves your body. Summary Radioiodine (I-131) therapy is a treatment for an overactive thyroid gland  (hyperthyroidism). This treatment involves swallowing a capsule or liquid that contains I-131. I-131 is manufactured iodine that gives off radiation. Your thyroid gland will absorb the I-131 over the next 3 months. The I-131 destroys thyroid cells and reverses hyperthyroidism. Follow instructions from your health care provider about how to take care of yourself and how to protect other people from exposure to radiation after the procedure. This information is not intended to replace advice given to you by your health care provider. Make sure you discuss any questions you have with your health care provider. Document Revised: 08/17/2021 Document Reviewed: 08/17/2021 Elsevier Patient Education  2023 Elsevier Inc.      Wait to have your labs drawn until 6 weeks after the ablation to assess response to treatment.   

## 2022-05-08 NOTE — Progress Notes (Signed)
05/08/2022     Endocrinology Consult Note    Subjective:    Patient ID: Nicole Gomez, female    DOB: 12/11/1970, PCP Caryl Bis, MD.   Past Medical History:  Diagnosis Date   Anemia    Asthma    Endometrial disorder     Past Surgical History:  Procedure Laterality Date   ABDOMINAL HYSTERECTOMY     Many years ago , [place]     many years ago at [facility] in [place] History   Marital status: Single    Spouse name: Not on file   Number of children: Not on file   Years of education: Not on file   Highest education level: Not on file  Occupational History   Not on file  Tobacco Use   Smoking status: Never   Smokeless tobacco: Never  Vaping Use   Vaping Use: Never used  Substance and Sexual Activity   Alcohol use: No   Drug use: No   Sexual activity: Not on file  Other Topics Concern   Not on file  Social History Narrative   Not on file   Social Determinants of Health   Financial Resource Strain: Not on file  Food Insecurity: Not on file  Transportation Needs: Not on file  Physical Activity: Not on file  Stress: Not on file  Social Connections: Not on file    Family History  Problem Relation Age of Onset   Hypertension Mother    Diabetes Mother    Hypertension Father    Diabetes Father    High Cholesterol Father     Outpatient Encounter Medications as of 05/08/2022  Medication Sig   albuterol (PROVENTIL HFA;VENTOLIN HFA) 108 (90 BASE) MCG/ACT inhaler Inhale 1-2 puffs into the lungs every 4 (four) hours as needed for wheezing or shortness of breath.   albuterol (PROVENTIL) (2.5 MG/3ML) 0.083% nebulizer solution Take 2.5 mg by nebulization every 6 (six) hours as needed for wheezing or shortness of breath.   sertraline (ZOLOFT) 100 MG tablet Take 100 mg by mouth daily.   No facility-administered encounter medications on file as of  05/08/2022.    ALLERGIES: No Known Allergies  VACCINATION STATUS: Immunization History  Administered Date(s) Administered   Tdap 07/30/2020     HPI  Nicole Gomez is 51 y.o. female who presents today with a medical history as above. she is being seen in consultation for hyperthyroidism requested by Caryl Bis, MD.  she has been dealing with symptoms of tremors, anxiety, and palpitations for several months. These symptoms are progressively worsening and troubling to her.  her most recent thyroid labs revealed suppressed TSH of 0.010 and T4 of 6.5, T3 uptake of 21, Free Thyroxine index of 1.4 on 03/26/22.  Her PCP did send her for uptake and scan on 04/17/22 which shows hot nodule in right thyroid lobe and decreased uptake in other areas, consistent with toxic thyroid adenoma.  she denies dysphagia, choking, shortness of breath, no recent voice change.    she denies family history of thyroid dysfunction and denies family hx of thyroid cancer. she denies personal history of goiter. she is not on any anti-thyroid medications nor on any thyroid hormone supplements. Denies use of Biotin containing supplements (she does take a womens MVI).  she is willing to proceed with appropriate work up and therapy  for thyrotoxicosis.   Review of systems  Constitutional: + Minimally fluctuating body weight, current Body mass index is 54.72 kg/m., no fatigue, no subjective hyperthermia, no subjective hypothermia Eyes: + blurry vision has chronic eye disease which will require surgery next year, no xerophthalmia ENT: no sore throat, no nodules palpated in throat, no dysphagia/odynophagia, no hoarseness Cardiovascular: no chest pain, no shortness of breath, + palpitations, no leg swelling Respiratory: no cough, no shortness of breath Gastrointestinal: no nausea/vomiting/diarrhea Musculoskeletal: no muscle/joint aches Skin: no rashes, no hyperemia Neurological: + tremors, no numbness, no tingling, no  dizziness Psychiatric: no depression, + anxiety   Objective:    BP 139/77 (BP Location: Right Arm, Patient Position: Sitting, Cuff Size: Large)   Temp (!) 91 F (32.8 C)   Ht 5\' 5"  (1.651 m)   Wt (!) 328 lb 12.8 oz (149.1 kg)   LMP 03/05/2014   BMI 54.72 kg/m   Wt Readings from Last 3 Encounters:  05/08/22 (!) 328 lb 12.8 oz (149.1 kg)  07/30/20 216 lb (98 kg)  03/10/14 273 lb (123.8 kg)     BP Readings from Last 3 Encounters:  05/08/22 139/77  07/30/20 (!) 159/87  05/01/20 133/84                       Physical Exam- Limited  Constitutional:  Body mass index is 54.72 kg/m. , not in acute distress, normal state of mind Eyes:  EOMI, no exophthalmos, left eye deviates from midline Neck: Supple Thyroid: mild fullness noted throughout, nodule both visible and palpable to right gland Cardiovascular: RRR, no murmurs, rubs, or gallops, no edema Respiratory: Adequate breathing efforts, no crackles, rales, rhonchi, or wheezing Musculoskeletal: no gross deformities, strength intact in all four extremities, no gross restriction of joint movements Skin:  no rashes, no hyperemia Neurological: + tremor with outstretched hands, DTR normal in BLE   CMP     Component Value Date/Time   NA 138 05/01/2020 1756   K 3.5 05/01/2020 1756   CL 105 05/01/2020 1756   CO2 21 (L) 05/01/2020 1756   GLUCOSE 123 (H) 05/01/2020 1756   BUN 7 05/01/2020 1756   CREATININE 0.78 05/01/2020 1756   CALCIUM 9.2 05/01/2020 1756   GFRNONAA >60 05/01/2020 1756     CBC    Component Value Date/Time   WBC 14.3 (H) 05/01/2020 1756   RBC 4.65 05/01/2020 1756   HGB 13.0 05/01/2020 1756   HCT 40.4 05/01/2020 1756   PLT 407 (H) 05/01/2020 1756   MCV 86.9 05/01/2020 1756   MCH 28.0 05/01/2020 1756   MCHC 32.2 05/01/2020 1756   RDW 14.1 05/01/2020 1756   LYMPHSABS 3.4 05/01/2020 1756   MONOABS 1.3 (H) 05/01/2020 1756   EOSABS 0.1 05/01/2020 1756   BASOSABS 0.1 05/01/2020 1756     Diabetic Labs  (most recent): No results found for: "HGBA1C", "MICROALBUR"  Lipid Panel  No results found for: "CHOL", "TRIG", "HDL", "CHOLHDL", "VLDL", "LDLCALC", "LDLDIRECT", "LABVLDL"   Lab Results  Component Value Date   TSH 0.01 (A) 03/26/2022     Uptake and Scan from 04/17/22 CLINICAL DATA:  Hyperthyroidism   EXAM: THYROID SCAN AND UPTAKE - 4 AND 24 HOURS   TECHNIQUE: Following oral administration of I-123 capsule, anterior planar imaging was acquired at 24 hours. Thyroid uptake was calculated with a thyroid probe at 4-6 hours and 24 hours.   RADIOPHARMACEUTICALS:  315 uCi I-123 sodium iodide p.o.   COMPARISON:  None Available.  FINDINGS: Large hot nodule in RIGHT thyroid lobe with suppression of uptake in remaining thyroid tissue consistent with a toxic RIGHT thyroid adenoma.   No cold nodule identified.   4 hour I-123 uptake = 13.3% (normal 5-20%)   24 hour I-123 uptake = 13.1% (normal 10-30%)   IMPRESSION: Large hot nodule RIGHT thyroid lobe with suppression of uptake in remaining thyroid tissue consistent with toxic adenoma.     Electronically Signed   By: Ulyses Southward M.D.   On: 04/17/2022 09:12   Assessment & Plan:   1) Toxic thyroid nodule  she is being seen at a kind request of Richardean Chimera, MD.  her history and most recent labs are reviewed, and she was examined clinically. 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 The potential risks of untreated thyrotoxicosis and the need for definitive therapy have been discussed in detail with her, and she agrees to proceed with treatment plan.   Options of therapy are discussed with her.  We discussed the option of treating it with medications including methimazole or PTU which may have side effects including rash, transaminitis, and bone marrow suppression.  We also discussed the option of definitive therapy with RAI ablation of the thyroid which is  preferred for toxic thyroid adenoma.  There is a possibility that she may not need thyroid hormone replacement in the future since there is higher uptake in the nodule and decreased in the other areas of the thyroid (which may be spared) in the radioactive iodine ablation process.  She agreed to proceed with RAI ablation.  She will need repeat TFTs 6 weeks post procedure to assess response to treatment and determine if thyroid hormone replacement is needed.  -Patient is made aware of the high likelihood of post ablative hypothyroidism with subsequent need for lifelong thyroid hormone replacement. sheunderstands this outcome and she is  willing to proceed.         -Patient is advised to maintain close follow up with Richardean Chimera, MD for primary care needs.   - Time spent with the patient: 60 minutes, of which >50% was spent in obtaining information about her symptoms, reviewing her previous labs, evaluations, and treatments, counseling her about her hyperthyroidism , and developing a plan to confirm the diagnosis and long term treatment as necessary. Please refer to "Patient Self Inventory" in the Media tab for reviewed elements of pertinent patient history.  Nicole Gomez participated in the discussions, expressed understanding, and voiced agreement with the above plans.  All questions were answered to her satisfaction. she is encouraged to contact clinic should she have any questions or concerns prior to her return visit.   Follow up plan: Return in about 2 months (around 07/08/2022) for Thyroid follow up, RAI ablation with Previsit labs.   Thank you for involving me in the care of this pleasant patient, and I will continue to update you with her progress.    Ronny Bacon, [facility] [address] [place], [place] [address] Phone: [phone] Fax: [phone]  05/08/2022, 10:54 AM

## 2022-05-13 NOTE — Written Directive (Addendum)
  I-131 WHOLE THYROID THERAPY (NON-CANCER)    RADIOPHARMACEUTICAL:   Iodine-131 Capsule    PRESCRIBED DOSE FOR ADMINISTRATION: 30 mCi  (thirty milliCuries)   ROUTE OFADMINISTRATION: PO   DIAGNOSIS:  Thyroid Toxic Adenoma   REFERRING PHYSICIAN: [place]   TSH:    Lab Results  Component Value Date   TSH 0.01 (A) 03/26/2022     PRIOR I-131 THERAPY (Date and Dose):   PRIOR RADIOLOGY EXAMS (Results and Date): NM THYROID MULT UPTAKE W/IMAGING  Result Date: 04/17/2022 CLINICAL DATA:  Hyperthyroidism EXAM: THYROID SCAN AND UPTAKE - 4 AND 24 HOURS TECHNIQUE: Following oral administration of I-123 capsule, anterior planar imaging was acquired at 24 hours. Thyroid uptake was calculated with a thyroid probe at 4-6 hours and 24 hours. RADIOPHARMACEUTICALS:  315 uCi I-123 sodium iodide p.o. COMPARISON:  None Available. FINDINGS: Large hot nodule in RIGHT thyroid lobe with suppression of uptake in remaining thyroid tissue consistent with a toxic RIGHT thyroid adenoma. No cold nodule identified. 4 hour I-123 uptake = 13.3% (normal 5-20%) 24 hour I-123 uptake = 13.1% (normal 10-30%) IMPRESSION: Large hot nodule RIGHT thyroid lobe with suppression of uptake in remaining thyroid tissue consistent with toxic adenoma. Electronically Signed   By: Ulyses Southward M.D.   On: 04/17/2022 09:12      ADDITIONAL PHYSICIAN COMMENTS/NOTES   AUTHORIZED USER SIGNATURE & TIME STAMP:

## 2022-05-21 ENCOUNTER — Ambulatory Visit (HOSPITAL_COMMUNITY)
Admission: RE | Admit: 2022-05-21 | Discharge: 2022-05-21 | Disposition: A | Discharge status: Home / Self Care | Payer: No Typology Code available for payment source | Source: Ambulatory Visit | Attending: Nurse Practitioner | Admitting: Nurse Practitioner

## 2022-05-21 DIAGNOSIS — E0510 Thyrotoxicosis with toxic single thyroid nodule without thyrotoxic crisis or storm: Secondary | ICD-10-CM | POA: Insufficient documentation

## 2022-05-21 MED ORDER — SODIUM IODIDE I 131 CAPSULE
30.0000 | Freq: Once | INTRAVENOUS | Status: AC | PRN
  Administered 2022-05-21: 30 via ORAL

## 2022-05-24 ENCOUNTER — Telehealth: Payer: Self-pay | Admitting: *Deleted

## 2022-05-24 NOTE — Telephone Encounter (Signed)
Patient called and given recommendation. She will try the Tylenol and Ibuprofen over weekend and if things are not better she will call office on Monday and request the prescription for oral steroids.

## 2022-05-24 NOTE — Telephone Encounter (Signed)
Nicole Gomez left a Engineer, technical sales. She states that she had a procedure on the  November14th of this month, on the  November 16th she had slight pain from her ear down her neck. Tenderness and discomfort. She also feels this when she swallows and turns her head. She ask if this is something normal ? She also ask is there anything she can take?

## 2022-05-24 NOTE — Telephone Encounter (Signed)
Yes, I do have patients that report tenderness in the neck area immediately following RAI ablation therapy.  This is usually do to inflammatory response.  She could take Ibuprofen or Tylenol as needed to help with the symptoms and if that does not help, I can order short course of oral steroids.  These symptoms usually subside within several days and do not require additional treatment, usually.

## 2022-07-10 ENCOUNTER — Ambulatory Visit: Payer: No Typology Code available for payment source | Admitting: Nurse Practitioner

## 2022-07-10 DIAGNOSIS — E0510 Thyrotoxicosis with toxic single thyroid nodule without thyrotoxic crisis or storm: Secondary | ICD-10-CM

## 2022-07-11 LAB — T3, FREE: T3, Free: 2 pg/mL (ref 2.0–4.4)

## 2022-07-11 LAB — T4, FREE: Free T4: 0.71 ng/dL — ABNORMAL LOW (ref 0.82–1.77)

## 2022-07-11 LAB — TSH: TSH: 0.111 u[IU]/mL — ABNORMAL LOW (ref 0.450–4.500)

## 2022-07-29 NOTE — Patient Instructions (Signed)

## 2022-07-30 ENCOUNTER — Encounter: Payer: Self-pay | Admitting: Nurse Practitioner

## 2022-07-30 ENCOUNTER — Ambulatory Visit (INDEPENDENT_AMBULATORY_CARE_PROVIDER_SITE_OTHER): Payer: No Typology Code available for payment source | Admitting: Nurse Practitioner

## 2022-07-30 VITALS — BP 136/82 | HR 82 | Ht 65.0 in | Wt 335.2 lb

## 2022-07-30 DIAGNOSIS — E0510 Thyrotoxicosis with toxic single thyroid nodule without thyrotoxic crisis or storm: Secondary | ICD-10-CM

## 2022-07-30 DIAGNOSIS — E890 Postprocedural hypothyroidism: Secondary | ICD-10-CM | POA: Diagnosis not present

## 2022-07-30 MED ORDER — LEVOTHYROXINE SODIUM 25 MCG PO TABS: 25.0000 ug | ORAL_TABLET | Freq: Every day | ORAL | 0 refills | Status: DC

## 2022-07-30 NOTE — Progress Notes (Signed)
07/30/2022     Endocrinology Consult Note    Subjective:    Patient ID: Nicole Gomez, female    DOB: Sep 07, 1970, PCP Caryl Bis, MD.   Past Medical History:  Diagnosis Date   Anemia    Asthma    Endometrial disorder     Past Surgical History:  Procedure Laterality Date   ABDOMINAL HYSTERECTOMY     Many years ago , [place]     many years ago at [facility] in [place] History   Marital status: Single    Spouse name: Not on file   Number of children: Not on file   Years of education: Not on file   Highest education level: Not on file  Occupational History   Not on file  Tobacco Use   Smoking status: Never   Smokeless tobacco: Never  Vaping Use   Vaping Use: Never used  Substance and Sexual Activity   Alcohol use: No   Drug use: No   Sexual activity: Not on file  Other Topics Concern   Not on file  Social History Narrative   Not on file   Social Determinants of Health   Financial Resource Strain: Not on file  Food Insecurity: Not on file  Transportation Needs: Not on file  Physical Activity: Not on file  Stress: Not on file  Social Connections: Not on file    Family History  Problem Relation Age of Onset   Hypertension Mother    Diabetes Mother    Hypertension Father    Diabetes Father    High Cholesterol Father     Outpatient Encounter Medications as of 07/30/2022  Medication Sig   albuterol (PROVENTIL HFA;VENTOLIN HFA) 108 (90 BASE) MCG/ACT inhaler Inhale 1-2 puffs into the lungs every 4 (four) hours as needed for wheezing or shortness of breath.   albuterol (PROVENTIL) (2.5 MG/3ML) 0.083% nebulizer solution Take 2.5 mg by nebulization every 6 (six) hours as needed for wheezing or shortness of breath.   levothyroxine (SYNTHROID) 25 MCG tablet Take 1 tablet (25 mcg total) by mouth daily.   sertraline (ZOLOFT) 100 MG tablet Take 100  mg by mouth daily.   No facility-administered encounter medications on file as of 07/30/2022.    ALLERGIES: No Known Allergies  VACCINATION STATUS: Immunization History  Administered Date(s) Administered   Tdap 07/30/2020     HPI  Nicole Gomez is 52 y.o. female who presents today with a medical history as above. she is being seen in consultation for hyperthyroidism requested by Caryl Bis, MD.  she has been dealing with symptoms of tremors, anxiety, and palpitations for several months. These symptoms are progressively worsening and troubling to her.  her most recent thyroid labs revealed suppressed TSH of 0.010 and T4 of 6.5, T3 uptake of 21, Free Thyroxine index of 1.4 on 03/26/22.  Her PCP did send her for uptake and scan on 04/17/22 which shows hot nodule in right thyroid lobe and decreased uptake in other areas, consistent with toxic thyroid adenoma.  she denies dysphagia, choking, shortness of breath, no recent voice change.    she denies family history of thyroid dysfunction and denies family hx of thyroid cancer. she denies personal history of goiter. she is not on any anti-thyroid medications nor on any thyroid hormone supplements. Denies use of Biotin containing supplements (she does  take a womens MVI).  she is willing to proceed with appropriate work up and therapy for thyrotoxicosis.   She is S/P RAI ablation for hyperthyroidism from Graves disease on 05/21/22.  Review of systems  Constitutional: + increasing body weight, current Body mass index is 55.78 kg/m., no fatigue, + subjective hyperthermia, no subjective hypothermia Eyes: + blurry vision has chronic eye disease which will require surgery next year, no xerophthalmia ENT: no sore throat, no nodules palpated in throat, no dysphagia/odynophagia, no hoarseness Cardiovascular: no chest pain, no shortness of breath, no palpitations, no leg swelling Respiratory: no cough, no shortness of breath Gastrointestinal:  no nausea/vomiting/diarrhea Musculoskeletal: no muscle/joint aches Skin: no rashes, no hyperemia Neurological: no tremors, no numbness, no tingling, no dizziness Psychiatric: no depression, + anxiety-improved   Objective:    BP 136/82 Comment: Retaken with manuel cuff  Pulse 82   Ht 5\' 5"  (1.651 m)   Wt (!) 335 lb 3.2 oz (152 kg)   LMP 03/05/2014   BMI 55.78 kg/m   Wt Readings from Last 3 Encounters:  07/30/22 (!) 335 lb 3.2 oz (152 kg)  05/08/22 (!) 328 lb 12.8 oz (149.1 kg)  07/30/20 216 lb (98 kg)     BP Readings from Last 3 Encounters:  07/30/22 136/82  05/08/22 139/77  07/30/20 (!) 159/87    Physical Exam- Limited  Constitutional:  Body mass index is 55.78 kg/m. , not in acute distress, normal state of mind Musculoskeletal: no gross deformities, strength intact in all four extremities, no gross restriction of joint movements Skin:  no rashes, no hyperemia Neurological: no tremor with outstretched hands   CMP     Component Value Date/Time   NA 138 05/01/2020 1756   K 3.5 05/01/2020 1756   CL 105 05/01/2020 1756   CO2 21 (L) 05/01/2020 1756   GLUCOSE 123 (H) 05/01/2020 1756   BUN 7 05/01/2020 1756   CREATININE 0.78 05/01/2020 1756   CALCIUM 9.2 05/01/2020 1756   GFRNONAA >60 05/01/2020 1756     CBC    Component Value Date/Time   WBC 14.3 (H) 05/01/2020 1756   RBC 4.65 05/01/2020 1756   HGB 13.0 05/01/2020 1756   HCT 40.4 05/01/2020 1756   PLT 407 (H) 05/01/2020 1756   MCV 86.9 05/01/2020 1756   MCH 28.0 05/01/2020 1756   MCHC 32.2 05/01/2020 1756   RDW 14.1 05/01/2020 1756   LYMPHSABS 3.4 05/01/2020 1756   MONOABS 1.3 (H) 05/01/2020 1756   EOSABS 0.1 05/01/2020 1756   BASOSABS 0.1 05/01/2020 1756     Diabetic Labs (most recent): No results found for: "HGBA1C", "MICROALBUR"  Lipid Panel  No results found for: "CHOL", "TRIG", "HDL", "CHOLHDL", "VLDL", "LDLCALC", "LDLDIRECT", "LABVLDL"   Lab Results  Component Value Date   TSH 0.111 (L)  07/10/2022   TSH 0.01 (A) 03/26/2022   FREET4 0.71 (L) 07/10/2022     Uptake and Scan from 04/17/22 CLINICAL DATA:  Hyperthyroidism   EXAM: THYROID SCAN AND UPTAKE - 4 AND 24 HOURS   TECHNIQUE: Following oral administration of I-123 capsule, anterior planar imaging was acquired at 24 hours. Thyroid uptake was calculated with a thyroid probe at 4-6 hours and 24 hours.   RADIOPHARMACEUTICALS:  315 uCi I-123 sodium iodide p.o.   COMPARISON:  None Available.   FINDINGS: Large hot nodule in RIGHT thyroid lobe with suppression of uptake in remaining thyroid tissue consistent with a toxic RIGHT thyroid adenoma.   No cold nodule identified.   4  hour I-123 uptake = 13.3% (normal 5-20%)   24 hour I-123 uptake = 13.1% (normal 10-30%)   IMPRESSION: Large hot nodule RIGHT thyroid lobe with suppression of uptake in remaining thyroid tissue consistent with toxic adenoma.     Electronically Signed   By: Ulyses Southward M.D.   On: 04/17/2022 09:12   Latest Reference Range & Units 03/26/22 00:00 07/10/22 08:43  TSH 0.450 - 4.500 uIU/mL 0.01 ! 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  Her repeat thyroid labs show still suppressed TSH but improving and low Free T4, both indicating a successful ablation therapy.  There was a possibility that she may not need thyroid hormone in the future due to toxic nodule, but it appears the RAI ablation must've affected the surrounding thyroid tissue as well.  Will start her on low dose Levothyroxine 25 mcg po daily before breakfast.   - The correct intake of thyroid hormone (Levothyroxine, Synthroid), is on empty stomach first thing in the  morning, with water, separated by at least 30 minutes from breakfast and other medications,  and separated by more than 4 hours from calcium, iron, multivitamins, acid reflux medications (PPIs).  - This medication is a life-long medication and will be needed to correct thyroid hormone imbalances for the rest of your life.  The dose may change from time to time, based on thyroid blood work.  - It is extremely important to be consistent taking this medication, near the same time each morning.  -AVOID TAKING PRODUCTS CONTAINING BIOTIN (commonly found in Hair, Skin, Nails vitamins) AS IT INTERFERES WITH THE VALIDITY OF THYROID FUNCTION BLOOD TESTS.  Will recheck labs in 2 months.     -Patient is advised to maintain close follow up with Richardean Chimera, MD for primary care needs.    I spent 20 minutes in the care of the patient today including review of labs from Thyroid Function, CMP, and other relevant labs ; imaging/biopsy records (current and previous including abstractions from other facilities); face-to-face time discussing  her lab results and symptoms, medications doses, her options of short and long term treatment based on the latest standards of care / guidelines;   and documenting the encounter.  Brantley Fling  participated in the discussions, expressed understanding, and voiced agreement with the above plans.  All questions were answered to her satisfaction. she is encouraged to contact clinic should she have any questions or concerns prior to her return visit.  Follow up plan: Return in about 2 months (around 09/28/2022) for Thyroid follow up, Previsit labs.   Thank you for involving me in the care of this pleasant patient, and I will continue to update you with her progress.    Ronny Bacon, [facility] [address] [place], [place] [address] Phone: [phone] Fax: [phone]  07/30/2022, 11:32 AM

## 2022-08-01 ENCOUNTER — Telehealth: Payer: Self-pay | Admitting: *Deleted

## 2022-08-01 NOTE — Telephone Encounter (Signed)
Patient left a message that she was trying a medication for her thyroid, she was seen for the first time this week. She states since taking the medication she is having dizziness , nervousness, and head and neck pain. She is taking Ibuprofen for the head and neck pain. She is asking what should she do, and is there something else that she can take for the head and neck pain?

## 2022-08-01 NOTE — Telephone Encounter (Signed)
Patient was called and made aware and agrees that she will call us back in 1 week.

## 2022-08-01 NOTE — Telephone Encounter (Signed)
Her body is likely just getting used to the medication.  Some patient report similar symptoms when first starting thyroid hormone but it levels out within a few days.  She can take Tylenol for her headache and neck pain.  If the symptoms continue after a week or so, have her reach back out and we will reassess.

## 2022-09-25 LAB — T4, FREE: Free T4: 0.65 ng/dL — ABNORMAL LOW (ref 0.82–1.77)

## 2022-09-25 LAB — TSH: TSH: 8.42 u[IU]/mL — ABNORMAL HIGH (ref 0.450–4.500)

## 2022-09-26 NOTE — Patient Instructions (Addendum)

## 2022-09-30 ENCOUNTER — Ambulatory Visit: Payer: No Typology Code available for payment source | Admitting: Nurse Practitioner

## 2022-09-30 ENCOUNTER — Encounter: Payer: Self-pay | Admitting: Nurse Practitioner

## 2022-09-30 VITALS — BP 138/70 | HR 82 | Ht 65.0 in | Wt 337.4 lb

## 2022-09-30 DIAGNOSIS — E890 Postprocedural hypothyroidism: Secondary | ICD-10-CM | POA: Diagnosis not present

## 2022-09-30 MED ORDER — LEVOTHYROXINE SODIUM 75 MCG PO TABS: 75.0000 ug | ORAL_TABLET | Freq: Every day | ORAL | 1 refills | Status: DC

## 2022-09-30 NOTE — Progress Notes (Signed)
09/30/2022     Endocrinology Follow Up Note    Subjective:    Patient ID: Nicole Gomez, female    DOB: May 29, 1971, PCP Caryl Bis, MD.   Past Medical History:  Diagnosis Date   Anemia    Asthma    Endometrial disorder     Past Surgical History:  Procedure Laterality Date   ABDOMINAL HYSTERECTOMY     Many years ago , [place]     many years ago at [facility] in [place] History   Marital status: Single    Spouse name: Not on file   Number of children: Not on file   Years of education: Not on file   Highest education level: Not on file  Occupational History   Not on file  Tobacco Use   Smoking status: Never   Smokeless tobacco: Never  Vaping Use   Vaping Use: Never used  Substance and Sexual Activity   Alcohol use: No   Drug use: No   Sexual activity: Not on file  Other Topics Concern   Not on file  Social History Narrative   Not on file   Social Determinants of Health   Financial Resource Strain: Not on file  Food Insecurity: Not on file  Transportation Needs: Not on file  Physical Activity: Not on file  Stress: Not on file  Social Connections: Not on file    Family History  Problem Relation Age of Onset   Hypertension Mother    Diabetes Mother    Hypertension Father    Diabetes Father    High Cholesterol Father     Outpatient Encounter Medications as of 09/30/2022  Medication Sig   albuterol (PROVENTIL HFA;VENTOLIN HFA) 108 (90 BASE) MCG/ACT inhaler Inhale 1-2 puffs into the lungs every 4 (four) hours as needed for wheezing or shortness of breath.   sertraline (ZOLOFT) 100 MG tablet Take 100 mg by mouth daily.   [DISCONTINUED] levothyroxine (SYNTHROID) 25 MCG tablet Take 1 tablet (25 mcg total) by mouth daily.   albuterol (PROVENTIL) (2.5 MG/3ML) 0.083% nebulizer solution Take 2.5 mg by nebulization every 6 (six) hours as needed  for wheezing or shortness of breath. (Patient not taking: Reported on 09/30/2022)   levothyroxine (SYNTHROID) 75 MCG tablet Take 1 tablet (75 mcg total) by mouth daily before breakfast.   No facility-administered encounter medications on file as of 09/30/2022.    ALLERGIES: No Known Allergies  VACCINATION STATUS: Immunization History  Administered Date(s) Administered   Tdap 07/30/2020     HPI  Nicole Gomez is 52 y.o. female who presents today with a medical history as above. she is being seen in follow up after being seen in consultation for hyperthyroidism requested by Caryl Bis, MD.  she has been dealing with symptoms of tremors, anxiety, and palpitations for several months. These symptoms are progressively worsening and troubling to her.  her most recent thyroid labs revealed suppressed TSH of 0.010 and T4 of 6.5, T3 uptake of 21, Free Thyroxine index of 1.4 on 03/26/22.  Her PCP did send her for uptake and scan on 04/17/22 which shows hot nodule in right thyroid lobe and decreased uptake in other areas, consistent with toxic thyroid adenoma.  she denies dysphagia, choking, shortness of breath, no recent voice change.    she denies family history of thyroid dysfunction and denies  family hx of thyroid cancer. she denies personal history of goiter. she is not on any anti-thyroid medications nor on any thyroid hormone supplements. Denies use of Biotin containing supplements (she does take a womens MVI).  she is willing to proceed with appropriate work up and therapy for thyrotoxicosis.   She is S/P RAI ablation for hyperthyroidism from Graves disease on 05/21/22.  Review of systems  Constitutional: + increasing body weight,  current Body mass index is 56.15 kg/m. , no fatigue, no subjective hyperthermia, no subjective hypothermia, + appetite changes Eyes: no blurry vision, no xerophthalmia ENT: no sore throat, no nodules palpated in throat, no dysphagia/odynophagia, no  hoarseness Cardiovascular: no chest pain, no shortness of breath, no palpitations, no leg swelling Respiratory: no cough, no shortness of breath Gastrointestinal: no nausea/vomiting/diarrhea Musculoskeletal: + diffuse muscle/joint aches Skin: no rashes, no hyperemia Neurological: no tremors, no numbness, no tingling, no dizziness Psychiatric: no depression, no anxiety   Objective:    BP 138/70 (BP Location: Left Arm, Patient Position: Sitting, Cuff Size: Large)   Pulse 82   Ht 5\' 5"  (1.651 m)   Wt (!) 337 lb 6.4 oz (153 kg)   LMP 03/05/2014   BMI 56.15 kg/m   Wt Readings from Last 3 Encounters:  09/30/22 (!) 337 lb 6.4 oz (153 kg)  07/30/22 (!) 335 lb 3.2 oz (152 kg)  05/08/22 (!) 328 lb 12.8 oz (149.1 kg)     BP Readings from Last 3 Encounters:  09/30/22 138/70  07/30/22 136/82  05/08/22 139/77     Physical Exam- Limited  Constitutional:  Body mass index is 56.15 kg/m. , not in acute distress, normal state of mind Eyes:  EOMI, no exophthalmos Musculoskeletal: no gross deformities, strength intact in all four extremities, no gross restriction of joint movements Skin:  no rashes, no hyperemia Neurological: no tremor with outstretched hands   CMP     Component Value Date/Time   NA 138 05/01/2020 1756   K 3.5 05/01/2020 1756   CL 105 05/01/2020 1756   CO2 21 (L) 05/01/2020 1756   GLUCOSE 123 (H) 05/01/2020 1756   BUN 7 05/01/2020 1756   CREATININE 0.78 05/01/2020 1756   CALCIUM 9.2 05/01/2020 1756   GFRNONAA >60 05/01/2020 1756     CBC    Component Value Date/Time   WBC 14.3 (H) 05/01/2020 1756   RBC 4.65 05/01/2020 1756   HGB 13.0 05/01/2020 1756   HCT 40.4 05/01/2020 1756   PLT 407 (H) 05/01/2020 1756   MCV 86.9 05/01/2020 1756   MCH 28.0 05/01/2020 1756   MCHC 32.2 05/01/2020 1756   RDW 14.1 05/01/2020 1756   LYMPHSABS 3.4 05/01/2020 1756   MONOABS 1.3 (H) 05/01/2020 1756   EOSABS 0.1 05/01/2020 1756   BASOSABS 0.1 05/01/2020 1756      Diabetic Labs (most recent): No results found for: "HGBA1C", "MICROALBUR"  Lipid Panel  No results found for: "CHOL", "TRIG", "HDL", "CHOLHDL", "VLDL", "LDLCALC", "LDLDIRECT", "LABVLDL"   Lab Results  Component Value Date   TSH 8.420 (H) 09/24/2022   TSH 0.111 (L) 07/10/2022   TSH 0.01 (A) 03/26/2022   FREET4 0.65 (L) 09/24/2022   FREET4 0.71 (L) 07/10/2022     Uptake and Scan from 04/17/22 CLINICAL DATA:  Hyperthyroidism   EXAM: THYROID SCAN AND UPTAKE - 4 AND 24 HOURS   TECHNIQUE: Following oral administration of I-123 capsule, anterior planar imaging was acquired at 24 hours. Thyroid uptake was calculated with a thyroid probe at 4-6  hours and 24 hours.   RADIOPHARMACEUTICALS:  315 uCi I-123 sodium iodide p.o.   COMPARISON:  None Available.   FINDINGS: Large hot nodule in RIGHT thyroid lobe with suppression of uptake in remaining thyroid tissue consistent with a toxic RIGHT thyroid adenoma.   No cold nodule identified.   4 hour I-123 uptake = 13.3% (normal 5-20%)   24 hour I-123 uptake = 13.1% (normal 10-30%)   IMPRESSION: Large hot nodule RIGHT thyroid lobe with suppression of uptake in remaining thyroid tissue consistent with toxic adenoma.     Electronically Signed   By: Lavonia Dana M.D.   On: 04/17/2022 09:12   Latest Reference Range & Units 03/26/22 00:00 07/10/22 08:43 09/24/22 16:12  TSH 0.450 - 4.500 uIU/mL 0.01 ! 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  Her previsit thyroid function tests are consistent with under-replacement.  She is advised to increase her Levothyroxine to 75 mcg  po daily before breakfast.  She may take 3 of her current 25 mcg tabs until she depletes her supply.   - The correct intake of thyroid hormone (Levothyroxine, Synthroid), is on empty stomach first thing in the morning, with water, separated by at least 30 minutes from breakfast and other medications,  and separated by more than 4 hours from calcium, iron, multivitamins, acid reflux medications (PPIs).  - This medication is a life-long medication and will be needed to correct thyroid hormone imbalances for the rest of your life.  The dose may change from time to time, based on thyroid blood work.  - It is extremely important to be consistent taking this medication, near the same time each morning.  -AVOID TAKING PRODUCTS CONTAINING BIOTIN (commonly found in Hair, Skin, Nails vitamins) AS IT INTERFERES WITH THE VALIDITY OF THYROID FUNCTION BLOOD TESTS.     -Patient is advised to maintain close follow up with Caryl Bis, MD for primary care needs.     I spent  15  minutes in the care of the patient today including review of labs from Thyroid Function, CMP, and other relevant labs ; imaging/biopsy records (current and previous including abstractions from other facilities); face-to-face time discussing  her lab results and symptoms, medications doses, her options of short and long term treatment based on the latest standards of care / guidelines;   and documenting the encounter.  Nicole Gomez  participated in the discussions, expressed understanding, and voiced agreement with the above plans.  All questions were answered to her satisfaction. she is encouraged to contact clinic should she have any questions or concerns prior to her return visit.  Follow up plan: Return in about 3 months (around 12/31/2022) for Thyroid follow up, Previsit labs.   Thank you for involving me in the care of this pleasant patient, and I will continue to update you with her progress.   Rayetta Pigg,  [facility] [address] [place],  [address] Phone: [phone] Fax: [phone]  09/30/2022, 10:09 AM

## 2022-10-17 ENCOUNTER — Other Ambulatory Visit: Payer: Self-pay | Admitting: Nurse Practitioner

## 2022-12-26 LAB — T4, FREE: Free T4: 0.93 ng/dL (ref 0.82–1.77)

## 2022-12-26 LAB — TSH: TSH: 1.25 u[IU]/mL (ref 0.450–4.500)

## 2022-12-30 NOTE — Patient Instructions (Signed)

## 2023-01-01 ENCOUNTER — Other Ambulatory Visit: Payer: Self-pay | Admitting: Nurse Practitioner

## 2023-01-01 ENCOUNTER — Ambulatory Visit: Payer: No Typology Code available for payment source | Admitting: Nurse Practitioner

## 2023-01-01 ENCOUNTER — Encounter: Payer: Self-pay | Admitting: Nurse Practitioner

## 2023-01-01 VITALS — BP 136/84 | HR 82 | Ht 65.0 in | Wt 337.6 lb

## 2023-01-01 DIAGNOSIS — E890 Postprocedural hypothyroidism: Secondary | ICD-10-CM | POA: Diagnosis not present

## 2023-01-01 MED ORDER — LEVOTHYROXINE SODIUM 88 MCG PO TABS: 88.0000 ug | ORAL_TABLET | Freq: Every day | ORAL | 1 refills | Status: DC

## 2023-01-01 NOTE — Progress Notes (Signed)
01/01/2023     Endocrinology Follow Up Note    Subjective:    Patient ID: Nicole Gomez, female    DOB: 1971/02/26, PCP Richardean Chimera, MD.   Past Medical History:  Diagnosis Date   Anemia    Asthma    Endometrial disorder     Past Surgical History:  Procedure Laterality Date   ABDOMINAL HYSTERECTOMY     Many years ago , [facility] [place]   CHOLECYSTECTOMY     GALLBLADDER SURGERY     many years ago at [facility] in [place]    Social History   Socioeconomic History   Marital status: Single    Spouse name: Not on file   Number of children: Not on file   Years of education: Not on file   Highest education level: Not on file  Occupational History   Not on file  Tobacco Use   Smoking status: Never   Smokeless tobacco: Never  Vaping Use   Vaping Use: Never used  Substance and Sexual Activity   Alcohol use: No   Drug use: No   Sexual activity: Not on file  Other Topics Concern   Not on file  Social History Narrative   Not on file   Social Determinants of Health   Financial Resource Strain: Not on file  Food Insecurity: Not on file  Transportation Needs: Not on file  Physical Activity: Not on file  Stress: Not on file  Social Connections: Not on file    Family History  Problem Relation Age of Onset   Hypertension Mother    Diabetes Mother    Hypertension Father    Diabetes Father    High Cholesterol Father     Outpatient Encounter Medications as of 01/01/2023  Medication Sig   albuterol (PROVENTIL HFA;VENTOLIN HFA) 108 (90 BASE) MCG/ACT inhaler Inhale 1-2 puffs into the lungs every 4 (four) hours as needed for wheezing or shortness of breath.   albuterol (PROVENTIL) (2.5 MG/3ML) 0.083% nebulizer solution Take 2.5 mg by nebulization every 6 (six) hours as needed for wheezing or shortness of breath.   Ascorbic Acid (VITAMIN C) 500 MG CAPS Take 500 mg by mouth daily.   Cholecalciferol (VITAMIN D-3 PO) Take by mouth daily.    CRANBERRY PO Take by mouth daily.   sertraline (ZOLOFT) 100 MG tablet Take 100 mg by mouth daily.   [DISCONTINUED] levothyroxine (SYNTHROID) 75 MCG tablet Take 1 tablet (75 mcg total) by mouth daily before breakfast.   levothyroxine (SYNTHROID) 88 MCG tablet Take 1 tablet (88 mcg total) by mouth daily before breakfast.   No facility-administered encounter medications on file as of 01/01/2023.    ALLERGIES: No Known Allergies  VACCINATION STATUS: Immunization History  Administered Date(s) Administered   Tdap 07/30/2020     HPI  Nicole Gomez is 52 y.o. female who presents today with a medical history as above. she is being seen in follow up after being seen in consultation for hyperthyroidism requested by Richardean Chimera, MD.  she has been dealing with symptoms of tremors, anxiety, and palpitations for several months. These symptoms are progressively worsening and troubling to her.  her most recent thyroid labs revealed suppressed TSH of 0.010 and T4 of 6.5, T3 uptake of 21, Free Thyroxine index of 1.4 on 03/26/22.  Her PCP did send her for uptake and scan on 04/17/22 which shows hot nodule in right thyroid lobe and decreased uptake in other areas,  consistent with toxic thyroid adenoma.  she denies dysphagia, choking, shortness of breath, no recent voice change.    she denies family history of thyroid dysfunction and denies family hx of thyroid cancer. she denies personal history of goiter. she is not on any anti-thyroid medications nor on any thyroid hormone supplements. Denies use of Biotin containing supplements (she does take a womens MVI).  she is willing to proceed with appropriate work up and therapy for thyrotoxicosis.   She is S/P RAI ablation for hyperthyroidism from Graves disease on 05/21/22.    Review of systems  Constitutional: + increasing body weight,  current Body mass index is 56.18 kg/m. , + fatigue (has good days and bad days), no subjective hyperthermia, no  subjective hypothermia Eyes: no blurry vision, no xerophthalmia ENT: no sore throat, no nodules palpated in throat, no dysphagia/odynophagia, no hoarseness Cardiovascular: no chest pain, no shortness of breath, no palpitations, no leg swelling Respiratory: no cough, no shortness of breath Gastrointestinal: no nausea/vomiting/diarrhea Musculoskeletal: no muscle/joint aches Skin: no rashes, no hyperemia Neurological: no tremors, no numbness, no tingling, no dizziness Psychiatric: no depression, no anxiety   Objective:    BP 136/84 (BP Location: Left Arm, Patient Position: Sitting, Cuff Size: Large)   Pulse 82   Ht 5\' 5"  (1.651 m)   Wt (!) 337 lb 9.6 oz (153.1 kg)   LMP 03/05/2014   BMI 56.18 kg/m   Wt Readings from Last 3 Encounters:  01/01/23 (!) 337 lb 9.6 oz (153.1 kg)  09/30/22 (!) 337 lb 6.4 oz (153 kg)  07/30/22 (!) 335 lb 3.2 oz (152 kg)     BP Readings from Last 3 Encounters:  01/01/23 136/84  09/30/22 138/70  07/30/22 136/82      Physical Exam- Limited  Constitutional:  Body mass index is 56.18 kg/m. , not in acute distress, normal state of mind Eyes:  EOMI, no exophthalmos Musculoskeletal: no gross deformities, strength intact in all four extremities, no gross restriction of joint movements Skin:  no rashes, no hyperemia Neurological: no tremor with outstretched hands   CMP     Component Value Date/Time   NA 138 05/01/2020 1756   K 3.5 05/01/2020 1756   CL 105 05/01/2020 1756   CO2 21 (L) 05/01/2020 1756   GLUCOSE 123 (H) 05/01/2020 1756   BUN 7 05/01/2020 1756   CREATININE 0.78 05/01/2020 1756   CALCIUM 9.2 05/01/2020 1756   GFRNONAA >60 05/01/2020 1756     CBC    Component Value Date/Time   WBC 14.3 (H) 05/01/2020 1756   RBC 4.65 05/01/2020 1756   HGB 13.0 05/01/2020 1756   HCT 40.4 05/01/2020 1756   PLT 407 (H) 05/01/2020 1756   MCV 86.9 05/01/2020 1756   MCH 28.0 05/01/2020 1756   MCHC 32.2 05/01/2020 1756   RDW 14.1 05/01/2020 1756    LYMPHSABS 3.4 05/01/2020 1756   MONOABS 1.3 (H) 05/01/2020 1756   EOSABS 0.1 05/01/2020 1756   BASOSABS 0.1 05/01/2020 1756     Diabetic Labs (most recent): No results found for: "HGBA1C", "MICROALBUR"  Lipid Panel  No results found for: "CHOL", "TRIG", "HDL", "CHOLHDL", "VLDL", "LDLCALC", "LDLDIRECT", "LABVLDL"   Lab Results  Component Value Date   TSH 1.250 12/25/2022   TSH 8.420 (H) 09/24/2022   TSH 0.111 (L) 07/10/2022   TSH 0.01 (A) 03/26/2022   FREET4 0.93 12/25/2022   FREET4 0.65 (L) 09/24/2022   FREET4 0.71 (L) 07/10/2022     Uptake and Scan from  04/17/22 CLINICAL DATA:  Hyperthyroidism   EXAM: THYROID SCAN AND UPTAKE - 4 AND 24 HOURS   TECHNIQUE: Following oral administration of I-123 capsule, anterior planar imaging was acquired at 24 hours. Thyroid uptake was calculated with a thyroid probe at 4-6 hours and 24 hours.   RADIOPHARMACEUTICALS:  315 uCi I-123 sodium iodide p.o.   COMPARISON:  None Available.   FINDINGS: Large hot nodule in RIGHT thyroid lobe with suppression of uptake in remaining thyroid tissue consistent with a toxic RIGHT thyroid adenoma.   No cold nodule identified.   4 hour I-123 uptake = 13.3% (normal 5-20%)   24 hour I-123 uptake = 13.1% (normal 10-30%)   IMPRESSION: Large hot nodule RIGHT thyroid lobe with suppression of uptake in remaining thyroid tissue consistent with toxic adenoma.     Electronically Signed   By: Ulyses Southward M.D.   On: 04/17/2022 09:12   Latest Reference Range & Units 03/26/22 00:00 07/10/22 08:43 09/24/22 16:12 12/25/22 14:58  TSH 0.450 - 4.500 uIU/mL 0.01 ! 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  Her previsit thyroid function tests are consistent with under-replacement.  She is advised to increase her Levothyroxine to 88 mcg po daily before breakfast.     - The correct intake of thyroid hormone (Levothyroxine, Synthroid), is on empty stomach first thing in the morning, with water, separated by at least 30 minutes from breakfast and other medications,  and separated by more than 4 hours from calcium, iron, multivitamins, acid reflux medications (PPIs).  - This medication is a life-long medication and will be needed to correct thyroid hormone imbalances for the rest of your life.  The dose may change from time to time, based on thyroid blood work.  - It is extremely important to be consistent taking this medication, near the same time each morning.  -AVOID TAKING PRODUCTS CONTAINING BIOTIN (commonly found in Hair, Skin, Nails vitamins) AS IT INTERFERES WITH THE VALIDITY OF THYROID FUNCTION BLOOD TESTS.     -Patient is advised to maintain close follow up with Richardean Chimera, MD for primary care needs.   I spent  30  minutes in the care of the patient today including review of labs from Thyroid Function, CMP, and other relevant labs ; imaging/biopsy records (current and previous including abstractions from other facilities); face-to-face time discussing  her lab results and symptoms, medications doses, her options of short and long term treatment based on the latest standards of care / guidelines;   and documenting the encounter.  Nicole Gomez  participated in the discussions, expressed understanding, and voiced agreement with the above plans.  All questions were answered to her satisfaction. she is encouraged to contact clinic should she have any questions or concerns prior to her return visit.  Follow up plan: Return in about 3 months (around 04/03/2023) for Thyroid follow up, Previsit labs.   Thank you for  involving me in the care of this pleasant patient, and I will continue to update you with her progress.   Ronny Bacon, FNP-BC [facility] [address] [place], [place]  [address] Phone: [phone] Fax: [phone]  01/01/2023, 8:27 AM

## 2023-03-04 ENCOUNTER — Encounter: Payer: Self-pay | Admitting: Nurse Practitioner

## 2023-03-27 LAB — TSH: TSH: 2.99 u[IU]/mL (ref 0.450–4.500)

## 2023-03-27 LAB — T4, FREE: Free T4: 0.93 ng/dL (ref 0.82–1.77)

## 2023-04-04 ENCOUNTER — Ambulatory Visit: Payer: No Typology Code available for payment source | Admitting: Nurse Practitioner

## 2023-04-07 ENCOUNTER — Ambulatory Visit (INDEPENDENT_AMBULATORY_CARE_PROVIDER_SITE_OTHER): Payer: No Typology Code available for payment source | Admitting: Nurse Practitioner

## 2023-04-07 ENCOUNTER — Encounter: Payer: Self-pay | Admitting: Nurse Practitioner

## 2023-04-07 VITALS — BP 137/81 | HR 93 | Ht 65.0 in | Wt 333.2 lb

## 2023-04-07 DIAGNOSIS — E890 Postprocedural hypothyroidism: Secondary | ICD-10-CM | POA: Diagnosis not present

## 2023-04-07 MED ORDER — LEVOTHYROXINE SODIUM 100 MCG PO TABS: 100.0000 ug | ORAL_TABLET | Freq: Every day | ORAL | 1 refills | Status: DC

## 2023-04-07 NOTE — Progress Notes (Signed)
04/07/2023     Endocrinology Follow Up Note    Subjective:    Patient ID: Nicole Gomez, female    DOB: 09-11-1970, PCP Nicole Chimera, MD.   Past Medical History:  Diagnosis Date   Anemia    Asthma    Endometrial disorder     Past Surgical History:  Procedure Laterality Date   ABDOMINAL HYSTERECTOMY     Many years ago , [facility] [place]   CHOLECYSTECTOMY     GALLBLADDER SURGERY     many years ago at [facility] in [place]    Social History   Socioeconomic History   Marital status: Single    Spouse name: Not on file   Number of children: Not on file   Years of education: Not on file   Highest education level: Not on file  Occupational History   Not on file  Tobacco Use   Smoking status: Never   Smokeless tobacco: Never  Vaping Use   Vaping status: Never Used  Substance and Sexual Activity   Alcohol use: No   Drug use: No   Sexual activity: Not on file  Other Topics Concern   Not on file  Social History Narrative   Not on file   Social Determinants of Health   Financial Resource Strain: Not on file  Food Insecurity: Not on file  Transportation Needs: Not on file  Physical Activity: Not on file  Stress: Not on file  Social Connections: Not on file    Family History  Problem Relation Age of Onset   Hypertension Mother    Diabetes Mother    Hypertension Father    Diabetes Father    High Cholesterol Father     Outpatient Encounter Medications as of 04/07/2023  Medication Sig   albuterol (PROVENTIL HFA;VENTOLIN HFA) 108 (90 BASE) MCG/ACT inhaler Inhale 1-2 puffs into the lungs every 4 (four) hours as needed for wheezing or shortness of breath.   albuterol (PROVENTIL) (2.5 MG/3ML) 0.083% nebulizer solution Take 2.5 mg by nebulization every 6 (six) hours as needed for wheezing or shortness of breath.   Ascorbic Acid (VITAMIN C) 500 MG CAPS Take 500 mg by mouth daily.   Cholecalciferol (VITAMIN D-3 PO) Take by mouth daily.    CRANBERRY PO Take by mouth daily.   sertraline (ZOLOFT) 100 MG tablet Take 100 mg by mouth daily.   [DISCONTINUED] levothyroxine (SYNTHROID) 88 MCG tablet Take 1 tablet (88 mcg total) by mouth daily before breakfast.   levothyroxine (SYNTHROID) 100 MCG tablet Take 1 tablet (100 mcg total) by mouth daily before breakfast.   No facility-administered encounter medications on file as of 04/07/2023.    ALLERGIES: No Known Allergies  VACCINATION STATUS: Immunization History  Administered Date(s) Administered   Tdap 07/30/2020     HPI  Nicole Gomez is 52 y.o. female who presents today with a medical history as above. she is being seen in follow up after being seen in consultation for hyperthyroidism requested by Nicole Chimera, MD.  she has been dealing with symptoms of tremors, anxiety, and palpitations for several months. These symptoms are progressively worsening and troubling to her.  her most recent thyroid labs revealed suppressed TSH of 0.010 and T4 of 6.5, T3 uptake of 21, Free Thyroxine index of 1.4 on 03/26/22.  Her PCP did send her for uptake and scan on 04/17/22 which shows hot nodule in right thyroid lobe and decreased uptake in other areas,  consistent with toxic thyroid adenoma.  she denies dysphagia, choking, shortness of breath, no recent voice change.    she denies family history of thyroid dysfunction and denies family hx of thyroid cancer. she denies personal history of goiter. she is not on any anti-thyroid medications nor on any thyroid hormone supplements. Denies use of Biotin containing supplements (she does take a womens MVI).  she is willing to proceed with appropriate work up and therapy for thyrotoxicosis.   She is S/P RAI ablation for hyperthyroidism from Graves disease on 05/21/22.    Review of systems  Constitutional: + decreasing body weight,  current Body mass index is 55.45 kg/m. , + fatigue (has good days and bad days), no subjective hyperthermia, no  subjective hypothermia Eyes: no blurry vision, no xerophthalmia ENT: no sore throat, no nodules palpated in throat, no dysphagia/odynophagia, no hoarseness Cardiovascular: no chest pain, no shortness of breath, no palpitations, no leg swelling Respiratory: no cough, no shortness of breath Gastrointestinal: no nausea/vomiting/diarrhea Musculoskeletal: no muscle/joint aches Skin: no rashes, no hyperemia Neurological: no tremors, no numbness, no tingling, no dizziness Psychiatric: no depression, no anxiety   Objective:    BP 137/81 (BP Location: Left Arm, Patient Position: Sitting, Cuff Size: Large)   Pulse 93   Ht 5\' 5"  (1.651 m)   Wt (!) 333 lb 3.2 oz (151.1 kg)   LMP 03/05/2014   BMI 55.45 kg/m   Wt Readings from Last 3 Encounters:  04/07/23 (!) 333 lb 3.2 oz (151.1 kg)  01/01/23 (!) 337 lb 9.6 oz (153.1 kg)  09/30/22 (!) 337 lb 6.4 oz (153 kg)     BP Readings from Last 3 Encounters:  04/07/23 137/81  01/01/23 136/84  09/30/22 138/70      Physical Exam- Limited  Constitutional:  Body mass index is 55.45 kg/m. , not in acute distress, normal state of mind Eyes:  EOMI, no exophthalmos Musculoskeletal: no gross deformities, strength intact in all four extremities, no gross restriction of joint movements Skin:  no rashes, no hyperemia Neurological: no tremor with outstretched hands   CMP     Component Value Date/Time   NA 138 05/01/2020 1756   K 3.5 05/01/2020 1756   CL 105 05/01/2020 1756   CO2 21 (L) 05/01/2020 1756   GLUCOSE 123 (H) 05/01/2020 1756   BUN 7 05/01/2020 1756   CREATININE 0.78 05/01/2020 1756   CALCIUM 9.2 05/01/2020 1756   GFRNONAA >60 05/01/2020 1756     CBC    Component Value Date/Time   WBC 14.3 (H) 05/01/2020 1756   RBC 4.65 05/01/2020 1756   HGB 13.0 05/01/2020 1756   HCT 40.4 05/01/2020 1756   PLT 407 (H) 05/01/2020 1756   MCV 86.9 05/01/2020 1756   MCH 28.0 05/01/2020 1756   MCHC 32.2 05/01/2020 1756   RDW 14.1 05/01/2020 1756    LYMPHSABS 3.4 05/01/2020 1756   MONOABS 1.3 (H) 05/01/2020 1756   EOSABS 0.1 05/01/2020 1756   BASOSABS 0.1 05/01/2020 1756     Diabetic Labs (most recent): No results found for: "HGBA1C", "MICROALBUR"  Lipid Panel  No results found for: "CHOL", "TRIG", "HDL", "CHOLHDL", "VLDL", "LDLCALC", "LDLDIRECT", "LABVLDL"   Lab Results  Component Value Date   TSH 2.990 03/26/2023   TSH 1.250 12/25/2022   TSH 8.420 (H) 09/24/2022   TSH 0.111 (L) 07/10/2022   TSH 0.01 (A) 03/26/2022   FREET4 0.93 03/26/2023   FREET4 0.93 12/25/2022   FREET4 0.65 (L) 09/24/2022   FREET4 0.71 (  L) 07/10/2022     Uptake and Scan from 04/17/22 CLINICAL DATA:  Hyperthyroidism   EXAM: THYROID SCAN AND UPTAKE - 4 AND 24 HOURS   TECHNIQUE: Following oral administration of I-123 capsule, anterior planar imaging was acquired at 24 hours. Thyroid uptake was calculated with a thyroid probe at 4-6 hours and 24 hours.   RADIOPHARMACEUTICALS:  315 uCi I-123 sodium iodide p.o.   COMPARISON:  None Available.   FINDINGS: Large hot nodule in RIGHT thyroid lobe with suppression of uptake in remaining thyroid tissue consistent with a toxic RIGHT thyroid adenoma.   No cold nodule identified.   4 hour I-123 uptake = 13.3% (normal 5-20%)   24 hour I-123 uptake = 13.1% (normal 10-30%)   IMPRESSION: Large hot nodule RIGHT thyroid lobe with suppression of uptake in remaining thyroid tissue consistent with toxic adenoma.     Electronically Signed   By: Ulyses Southward M.D.   On: 04/17/2022 09:12   Latest Reference Range & Units 03/26/22 00:00 07/10/22 08:43 09/24/22 16:12 12/25/22 14:58 03/26/23 08:20  TSH 0.450 - 4.500 uIU/mL 0.01 ! 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  Her previsit thyroid function tests are consistent with under-replacement.  She is advised to increase her Levothyroxine to 100 mcg po daily before breakfast.  Will recheck TFTs prior to next visit and adjust dose accordingly.   - The correct intake of thyroid hormone (Levothyroxine, Synthroid), is on empty stomach first thing in the morning, with water, separated by at least 30 minutes from breakfast and other medications,  and separated by more than 4 hours from calcium, iron, multivitamins, acid reflux medications (PPIs).  - This medication is a life-long medication and will be needed to correct thyroid hormone imbalances for the rest of your life.  The dose may change from time to time, based on thyroid blood work.  - It is extremely important to be consistent taking this medication, near the same time each morning.  -AVOID TAKING PRODUCTS CONTAINING BIOTIN (commonly found in Hair, Skin, Nails vitamins) AS IT INTERFERES WITH THE VALIDITY OF THYROID FUNCTION BLOOD TESTS.     -Patient is advised to maintain close follow up with Nicole Chimera, MD for primary care needs.   I spent  22  minutes in the care of the patient today including review of labs from Thyroid Function, CMP, and other relevant labs ; imaging/biopsy records (current and previous including abstractions from other facilities); face-to-face time discussing  her lab results and symptoms, medications doses, her options of short and long term treatment based on the latest standards of care / guidelines;   and documenting the encounter.  Nicole Gomez  participated in the discussions, expressed understanding, and voiced agreement with the above plans.  All questions were answered to her satisfaction. she is encouraged to contact clinic should she have any questions or concerns prior  to her return visit.  Follow up plan: Return in about 3 months (around 07/07/2023) for Thyroid follow up, Previsit labs.   Thank you for involving me in the care of this pleasant  patient, and I will continue to update you with her progress.   Ronny Bacon, [facility] [address] [place], [place] [address] Phone: [phone] Fax: [phone]  04/07/2023, 4:26 PM

## 2023-04-07 NOTE — Patient Instructions (Signed)

## 2023-06-09 ENCOUNTER — Encounter: Payer: Self-pay | Admitting: Nurse Practitioner

## 2023-06-20 ENCOUNTER — Other Ambulatory Visit: Payer: Self-pay | Admitting: Nurse Practitioner

## 2023-06-20 DIAGNOSIS — E890 Postprocedural hypothyroidism: Secondary | ICD-10-CM

## 2023-07-01 LAB — TSH: TSH: 1.92 u[IU]/mL (ref 0.450–4.500)

## 2023-07-01 LAB — T4, FREE: Free T4: 1.01 ng/dL (ref 0.82–1.77)

## 2023-07-08 NOTE — Patient Instructions (Signed)

## 2023-07-10 ENCOUNTER — Encounter: Payer: Self-pay | Admitting: Nurse Practitioner

## 2023-07-10 ENCOUNTER — Ambulatory Visit (INDEPENDENT_AMBULATORY_CARE_PROVIDER_SITE_OTHER): Payer: No Typology Code available for payment source | Admitting: Nurse Practitioner

## 2023-07-10 VITALS — BP 141/72 | HR 88 | Ht 65.0 in | Wt 333.6 lb

## 2023-07-10 DIAGNOSIS — E890 Postprocedural hypothyroidism: Secondary | ICD-10-CM

## 2023-07-10 MED ORDER — LEVOTHYROXINE SODIUM 125 MCG PO TABS: 125.0000 ug | ORAL_TABLET | Freq: Every day | ORAL | 1 refills | Status: DC

## 2023-07-10 NOTE — Progress Notes (Signed)
 07/10/2023     Endocrinology Follow Up Note    Subjective:    Patient ID: Nicole Gomez, female    DOB: [birth year], PCP Nicole Jerel MATSU, MD.   Past Medical History:  Diagnosis Date   Anemia    Asthma    Endometrial disorder     Past Surgical History:  Procedure Laterality Date   ABDOMINAL HYSTERECTOMY     Many years ago , [facility]   CHOLECYSTECTOMY     GALLBLADDER SURGERY     many years ago at [facility] in [place]    Social History   Socioeconomic History   Marital status: Single    Spouse name: Not on file   Number of children: Not on file   Years of education: Not on file   Highest education level: Not on file  Occupational History   Not on file  Tobacco Use   Smoking status: Never   Smokeless tobacco: Never  Vaping Use   Vaping status: Never Used  Substance and Sexual Activity   Alcohol use: No   Drug use: No   Sexual activity: Not on file  Other Topics Concern   Not on file  Social History Narrative   Not on file   Social Drivers of Health   Financial Resource Strain: Not on file  Food Insecurity: Not on file  Transportation Needs: Not on file  Physical Activity: Not on file  Stress: Not on file  Social Connections: Not on file    Family History  Problem Relation Age of Onset   Hypertension Mother    Diabetes Mother    Hypertension Father    Diabetes Father    High Cholesterol Father     Outpatient Encounter Medications as of 07/10/2023  Medication Sig   albuterol  (PROVENTIL  HFA;VENTOLIN  HFA) 108 (90 BASE) MCG/ACT inhaler Inhale 1-2 puffs into the lungs every 4 (four) hours as needed for wheezing or shortness of breath.   albuterol  (PROVENTIL ) (2.5 MG/3ML) 0.083% nebulizer solution Take 2.5 mg by nebulization every 6 (six) hours as needed for wheezing or shortness of breath.   Ascorbic Acid (VITAMIN C) 500 MG CAPS Take 500 mg by mouth daily.   Cholecalciferol (VITAMIN D -3 PO) Take by mouth daily.    CRANBERRY PO Take by mouth daily.   sertraline (ZOLOFT) 100 MG tablet Take 100 mg by mouth daily.   [DISCONTINUED] levothyroxine  (SYNTHROID ) 100 MCG tablet Take 1 tablet (100 mcg total) by mouth daily before breakfast.   levothyroxine  (SYNTHROID ) 125 MCG tablet Take 1 tablet (125 mcg total) by mouth daily before breakfast.   No facility-administered encounter medications on file as of 07/10/2023.    ALLERGIES: No Known Allergies  VACCINATION STATUS: Immunization History  Administered Date(s) Administered   Tdap 07/30/2020     HPI  Nicole Gomez is 53 y.o. female who presents today with a medical history as above. she is being seen in follow up after being seen in consultation for hyperthyroidism requested by Nicole Jerel MATSU, MD.  she has been dealing with symptoms of tremors, anxiety, and palpitations for several months. These symptoms are progressively worsening and troubling to her.  her most recent thyroid  labs revealed suppressed TSH of 0.010 and T4 of 6.5, T3 uptake of 21, Free Thyroxine index of 1.4 on 03/26/22.  Her PCP did send her for uptake and scan on 04/17/22 which shows hot nodule in right thyroid  lobe and decreased uptake in other areas,  consistent with toxic thyroid  adenoma.  she denies dysphagia, choking, shortness of breath, no recent voice change.    she denies family history of thyroid  dysfunction and denies family hx of thyroid  cancer. she denies personal history of goiter. she is not on any anti-thyroid  medications nor on any thyroid  hormone supplements. Denies use of Biotin containing supplements (she does take a womens MVI).  she is willing to proceed with appropriate work up and therapy for thyrotoxicosis.   She is S/P RAI ablation for hyperthyroidism from Graves disease on 05/21/22.   Review of systems  Constitutional: + stable body weight,  current Body mass index is 55.51 kg/m. , + fatigue (has good days and bad days), no subjective hyperthermia, no  subjective hypothermia Eyes: no blurry vision, no xerophthalmia ENT: no sore throat, no nodules palpated in throat, no dysphagia/odynophagia, no hoarseness Cardiovascular: no chest pain, no shortness of breath, no palpitations, no leg swelling Respiratory: no cough, no shortness of breath Gastrointestinal: no nausea/vomiting/diarrhea Musculoskeletal: no muscle/joint aches Skin: no rashes, no hyperemia Neurological: no tremors, no numbness, no tingling, no dizziness Psychiatric: no depression, no anxiety   Objective:    BP (!) 141/72 (BP Location: Left Arm, Patient Position: Sitting, Cuff Size: Large)   Pulse 88   Ht 5' 5 (1.651 m)   Wt (!) 333 lb 9.6 oz (151.3 kg)   LMP 03/05/2014   BMI 55.51 kg/m   Wt Readings from Last 3 Encounters:  07/10/23 (!) 333 lb 9.6 oz (151.3 kg)  04/07/23 (!) 333 lb 3.2 oz (151.1 kg)  01/01/23 (!) 337 lb 9.6 oz (153.1 kg)     BP Readings from Last 3 Encounters:  07/10/23 (!) 141/72  04/07/23 137/81  01/01/23 136/84     Physical Exam- Limited  Constitutional:  Body mass index is 55.51 kg/m. , not in acute distress, normal state of mind Eyes:  EOMI, no exophthalmos Musculoskeletal: no gross deformities, strength intact in all four extremities, no gross restriction of joint movements Skin:  no rashes, no hyperemia Neurological: no tremor with outstretched hands   CMP     Component Value Date/Time   NA 138 05/01/2020 1756   K 3.5 05/01/2020 1756   CL 105 05/01/2020 1756   CO2 21 (L) 05/01/2020 1756   GLUCOSE 123 (H) 05/01/2020 1756   BUN 7 05/01/2020 1756   CREATININE 0.78 05/01/2020 1756   CALCIUM 9.2 05/01/2020 1756   GFRNONAA >60 05/01/2020 1756     CBC    Component Value Date/Time   WBC 14.3 (H) 05/01/2020 1756   RBC 4.65 05/01/2020 1756   HGB 13.0 05/01/2020 1756   HCT 40.4 05/01/2020 1756   PLT 407 (H) 05/01/2020 1756   MCV 86.9 05/01/2020 1756   MCH 28.0 05/01/2020 1756   MCHC 32.2 05/01/2020 1756   RDW 14.1  05/01/2020 1756   LYMPHSABS 3.4 05/01/2020 1756   MONOABS 1.3 (H) 05/01/2020 1756   EOSABS 0.1 05/01/2020 1756   BASOSABS 0.1 05/01/2020 1756     Diabetic Labs (most recent): No results found for: HGBA1C, MICROALBUR  Lipid Panel  No results found for: CHOL, TRIG, HDL, CHOLHDL, VLDL, LDLCALC, LDLDIRECT, LABVLDL   Lab Results  Component Value Date   TSH 1.920 06/30/2023   TSH 2.990 03/26/2023   TSH 1.250 12/25/2022   TSH 8.420 (H) 09/24/2022   TSH 0.111 (L) 07/10/2022   TSH 0.01 (A) 03/26/2022   FREET4 1.01 06/30/2023   FREET4 0.93 03/26/2023   FREET4 0.93 12/25/2022  FREET4 0.65 (L) 09/24/2022   FREET4 0.71 (L) 07/10/2022     Uptake and Scan from 04/17/22 CLINICAL DATA:  Hyperthyroidism   EXAM: THYROID  SCAN AND UPTAKE - 4 AND 24 HOURS   TECHNIQUE: Following oral administration of I-123 capsule, anterior planar imaging was acquired at 24 hours. Thyroid  uptake was calculated with a thyroid  probe at 4-6 hours and 24 hours.   RADIOPHARMACEUTICALS:  315 uCi I-123 sodium iodide p.o.   COMPARISON:  None Available.   FINDINGS: Large hot nodule in RIGHT thyroid  lobe with suppression of uptake in remaining thyroid  tissue consistent with a toxic RIGHT thyroid  adenoma.   No cold nodule identified.   4 hour I-123 uptake = 13.3% (normal 5-20%)   24 hour I-123 uptake = 13.1% (normal 10-30%)   IMPRESSION: Large hot nodule RIGHT thyroid  lobe with suppression of uptake in remaining thyroid  tissue consistent with toxic adenoma.     Electronically Signed   By: Oneil Kiss M.D.   On: 04/17/2022 09:12   Latest Reference Range & Units 03/26/22 00:00 07/10/22 08:43 09/24/22 16:12 12/25/22 14:58 03/26/23 08:20 06/30/23 14:03  TSH 0.450 - 4.500 uIU/mL 0.01 ! 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  Her previsit thyroid  function tests show improvement, not quite at goal.  She is advised to increase her Levothyroxine  to 125 mcg po daily before breakfast.  Will recheck TFTs prior to next visit and adjust dose accordingly.   - The correct intake of thyroid  hormone (Levothyroxine , Synthroid ), is on empty stomach first thing in the morning, with water, separated by at least 30 minutes from breakfast and other medications,  and separated by more than 4 hours from calcium, iron, multivitamins, acid reflux medications (PPIs).  - This medication is a life-long medication and will be needed to correct thyroid  hormone imbalances for the rest of your life.  The dose may change from time to time, based on thyroid  blood work.  - It is extremely important to be consistent taking this medication, near the same time each morning.  -AVOID TAKING PRODUCTS CONTAINING BIOTIN (commonly found in Hair, Skin, Nails vitamins) AS IT INTERFERES WITH THE VALIDITY OF THYROID  FUNCTION BLOOD TESTS.     -Patient is advised to maintain close follow up with Nicole Jerel MATSU, MD for primary care needs.    I spent  29  minutes in the care of the patient today including review of labs from Thyroid  Function, CMP, and other relevant labs ; imaging/biopsy records (current and previous including abstractions from other facilities); face-to-face time discussing  her lab results and symptoms, medications doses, her options of short and long term treatment based on the latest standards of care / guidelines;   and documenting the encounter.  Nicole Gomez  participated in the discussions, expressed understanding, and voiced agreement with the above plans.  All questions were answered to her  satisfaction. she is encouraged to contact clinic should she have any questions or concerns prior to her return visit.  Follow up plan: Return in about 3 months (around 10/08/2023) for Thyroid  follow  up, Previsit labs.   Thank you for involving me in the care of this pleasant patient, and I will continue to update you with her progress.   Benton Rio, [facility] [address] [place], [place] [address] Phone: [phone] Fax: [phone]  07/10/2023, 8:49 AM

## 2023-07-11 ENCOUNTER — Encounter: Payer: Self-pay | Admitting: Nurse Practitioner

## 2023-09-20 ENCOUNTER — Other Ambulatory Visit: Payer: Self-pay | Admitting: Nurse Practitioner

## 2023-09-20 DIAGNOSIS — E890 Postprocedural hypothyroidism: Secondary | ICD-10-CM

## 2023-09-27 ENCOUNTER — Other Ambulatory Visit: Payer: Self-pay | Admitting: Nurse Practitioner

## 2023-09-27 DIAGNOSIS — E890 Postprocedural hypothyroidism: Secondary | ICD-10-CM

## 2023-10-03 LAB — TSH: TSH: 1.3 u[IU]/mL (ref 0.450–4.500)

## 2023-10-03 LAB — T4, FREE: Free T4: 1.16 ng/dL (ref 0.82–1.77)

## 2023-10-07 NOTE — Patient Instructions (Signed)

## 2023-10-08 ENCOUNTER — Encounter: Payer: Self-pay | Admitting: Nurse Practitioner

## 2023-10-08 ENCOUNTER — Ambulatory Visit: Payer: No Typology Code available for payment source | Admitting: Nurse Practitioner

## 2023-10-08 VITALS — BP 130/80 | HR 82 | Ht 65.0 in | Wt 337.8 lb

## 2023-10-08 DIAGNOSIS — E890 Postprocedural hypothyroidism: Secondary | ICD-10-CM | POA: Diagnosis not present

## 2023-10-08 DIAGNOSIS — R5382 Chronic fatigue, unspecified: Secondary | ICD-10-CM

## 2023-10-08 MED ORDER — LEVOTHYROXINE SODIUM 137 MCG PO TABS: 137.0000 ug | ORAL_TABLET | Freq: Every day | ORAL | 1 refills | Status: DC

## 2023-10-08 NOTE — Progress Notes (Signed)
 10/08/2023     Endocrinology Follow Up Note    Subjective:    Patient ID: Nicole Gomez, female    DOB: 11-04-1970, PCP Nicole Chimera, MD.   Past Medical History:  Diagnosis Date   Anemia    Asthma    Endometrial disorder     Past Surgical History:  Procedure Laterality Date   ABDOMINAL HYSTERECTOMY     Many years ago , [facility]   CHOLECYSTECTOMY     GALLBLADDER SURGERY     many years ago at [facility] in [place]    Social History   Socioeconomic History   Marital status: Single    Spouse name: Not on file   Number of children: Not on file   Years of education: Not on file   Highest education level: Not on file  Occupational History   Not on file  Tobacco Use   Smoking status: Never   Smokeless tobacco: Never  Vaping Use   Vaping status: Never Used  Substance and Sexual Activity   Alcohol use: No   Drug use: No   Sexual activity: Not on file  Other Topics Concern   Not on file  Social History Narrative   Not on file   Social Drivers of Health   Financial Resource Strain: Not on file  Food Insecurity: Not on file  Transportation Needs: Not on file  Physical Activity: Not on file  Stress: Not on file  Social Connections: Not on file    Family History  Problem Relation Age of Onset   Hypertension Mother    Diabetes Mother    Hypertension Father    Diabetes Father    High Cholesterol Father     Outpatient Encounter Medications as of 10/08/2023  Medication Sig   albuterol (PROVENTIL HFA;VENTOLIN HFA) 108 (90 BASE) MCG/ACT inhaler Inhale 1-2 puffs into the lungs every 4 (four) hours as needed for wheezing or shortness of breath.   albuterol (PROVENTIL) (2.5 MG/3ML) 0.083% nebulizer solution Take 2.5 mg by nebulization every 6 (six) hours as needed for wheezing or shortness of breath.   Ascorbic Acid (VITAMIN C) 500 MG CAPS Take 500 mg by mouth daily.   Cholecalciferol (VITAMIN D-3 PO) Take by mouth daily.    CRANBERRY PO Take by mouth daily.   sertraline (ZOLOFT) 100 MG tablet Take 100 mg by mouth daily.   [DISCONTINUED] levothyroxine (SYNTHROID) 125 MCG tablet Take 1 tablet (125 mcg total) by mouth daily before breakfast.   levothyroxine (SYNTHROID) 137 MCG tablet Take 1 tablet (137 mcg total) by mouth daily before breakfast.   No facility-administered encounter medications on file as of 10/08/2023.    ALLERGIES: No Known Allergies  VACCINATION STATUS: Immunization History  Administered Date(s) Administered   Tdap 07/30/2020     HPI  Nicole Gomez is 53 y.o. female who presents today with a medical history as above. she is being seen in follow up after being seen in consultation for hyperthyroidism requested by Nicole Chimera, MD.  she has been dealing with symptoms of tremors, anxiety, and palpitations for several months. These symptoms are progressively worsening and troubling to her.  her most recent thyroid labs revealed suppressed TSH of 0.010 and T4 of 6.5, T3 uptake of 21, Free Thyroxine index of 1.4 on 03/26/22.  Her PCP did send her for uptake and scan on 04/17/22 which shows hot nodule in right thyroid lobe and decreased uptake in other areas,  consistent with toxic thyroid adenoma.  she denies dysphagia, choking, shortness of breath, no recent voice change.    she denies family history of thyroid dysfunction and denies family hx of thyroid cancer. she denies personal history of goiter. she is not on any anti-thyroid medications nor on any thyroid hormone supplements. Denies use of Biotin containing supplements (she does take a womens MVI).  she is willing to proceed with appropriate work up and therapy for thyrotoxicosis.   She is S/P RAI ablation for hyperthyroidism from Graves disease on 05/21/22.   Review of systems  Constitutional: + Minimally fluctuating body weight,  current Body mass index is 56.21 kg/m. , + fatigue, no subjective hyperthermia, no subjective  hypothermia Eyes: no blurry vision, no xerophthalmia ENT: no sore throat, no nodules palpated in throat, no dysphagia/odynophagia, no hoarseness Cardiovascular: no chest pain, no shortness of breath, no palpitations, no leg swelling Respiratory: no cough, no shortness of breath Gastrointestinal: no nausea/vomiting/diarrhea Musculoskeletal: no muscle/joint aches Skin: no rashes, no hyperemia Neurological: no tremors, no numbness, no tingling, no dizziness Psychiatric: no depression, no anxiety   Objective:    BP 130/80 (BP Location: Right Arm, Patient Position: Sitting, Cuff Size: Large)   Pulse 82   Ht 5\' 5"  (1.651 m)   Wt (!) 337 lb 12.8 oz (153.2 kg)   LMP 03/05/2014   BMI 56.21 kg/m   Wt Readings from Last 3 Encounters:  10/08/23 (!) 337 lb 12.8 oz (153.2 kg)  07/10/23 (!) 333 lb 9.6 oz (151.3 kg)  04/07/23 (!) 333 lb 3.2 oz (151.1 kg)     BP Readings from Last 3 Encounters:  10/08/23 130/80  07/10/23 (!) 141/72  04/07/23 137/81     Physical Exam- Limited  Constitutional:  Body mass index is 56.21 kg/m. , not in acute distress, normal state of mind Eyes:  EOMI, no exophthalmos Musculoskeletal: no gross deformities, strength intact in all four extremities, no gross restriction of joint movements Skin:  no rashes, no hyperemia Neurological: no tremor with outstretched hands   CMP     Component Value Date/Time   NA 138 05/01/2020 1756   K 3.5 05/01/2020 1756   CL 105 05/01/2020 1756   CO2 21 (L) 05/01/2020 1756   GLUCOSE 123 (H) 05/01/2020 1756   BUN 7 05/01/2020 1756   CREATININE 0.78 05/01/2020 1756   CALCIUM 9.2 05/01/2020 1756   GFRNONAA >60 05/01/2020 1756     CBC    Component Value Date/Time   WBC 14.3 (H) 05/01/2020 1756   RBC 4.65 05/01/2020 1756   HGB 13.0 05/01/2020 1756   HCT 40.4 05/01/2020 1756   PLT 407 (H) 05/01/2020 1756   MCV 86.9 05/01/2020 1756   MCH 28.0 05/01/2020 1756   MCHC 32.2 05/01/2020 1756   RDW 14.1 05/01/2020 1756    LYMPHSABS 3.4 05/01/2020 1756   MONOABS 1.3 (H) 05/01/2020 1756   EOSABS 0.1 05/01/2020 1756   BASOSABS 0.1 05/01/2020 1756     Diabetic Labs (most recent): No results found for: "HGBA1C", "MICROALBUR"  Lipid Panel  No results found for: "CHOL", "TRIG", "HDL", "CHOLHDL", "VLDL", "LDLCALC", "LDLDIRECT", "LABVLDL"   Lab Results  Component Value Date   TSH 1.300 10/01/2023   TSH 1.920 06/30/2023   TSH 2.990 03/26/2023   TSH 1.250 12/25/2022   TSH 8.420 (H) 09/24/2022   TSH 0.111 (L) 07/10/2022   TSH 0.01 (A) 03/26/2022   FREET4 1.16 10/01/2023   FREET4 1.01 06/30/2023   FREET4 0.93 03/26/2023  FREET4 0.93 12/25/2022   FREET4 0.65 (L) 09/24/2022   FREET4 0.71 (L) 07/10/2022     Uptake and Scan from 04/17/22 CLINICAL DATA:  Hyperthyroidism   EXAM: THYROID SCAN AND UPTAKE - 4 AND 24 HOURS   TECHNIQUE: Following oral administration of I-123 capsule, anterior planar imaging was acquired at 24 hours. Thyroid uptake was calculated with a thyroid probe at 4-6 hours and 24 hours.   RADIOPHARMACEUTICALS:  315 uCi I-123 sodium iodide p.o.   COMPARISON:  None Available.   FINDINGS: Large hot nodule in RIGHT thyroid lobe with suppression of uptake in remaining thyroid tissue consistent with a toxic RIGHT thyroid adenoma.   No cold nodule identified.   4 hour I-123 uptake = 13.3% (normal 5-20%)   24 hour I-123 uptake = 13.1% (normal 10-30%)   IMPRESSION: Large hot nodule RIGHT thyroid lobe with suppression of uptake in remaining thyroid tissue consistent with toxic adenoma.     Electronically Signed   By: Ulyses Southward M.D.   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  Her previsit thyroid function tests show improvement, not quite at goal.  She is advised to increase her Levothyroxine to 137 mcg po daily before breakfast.  Will recheck TFTs prior to next visit and adjust dose accordingly.   - The correct intake of thyroid hormone (Levothyroxine, Synthroid), is on empty stomach first thing in the morning, with water, separated by at least 30 minutes from breakfast and other medications,  and separated by more than 4 hours from calcium, iron, multivitamins, acid reflux medications (PPIs).  - This medication is a life-long medication and will be needed to correct thyroid hormone imbalances for the rest of your life.  The dose may change from time to time, based on thyroid blood work.  - It is extremely important to be consistent taking this medication, near the same time each morning.  -AVOID TAKING PRODUCTS CONTAINING BIOTIN (commonly found in Hair, Skin, Nails vitamins) AS IT INTERFERES WITH THE VALIDITY OF THYROID FUNCTION BLOOD TESTS.  -due to chronic fatigue, will also check Vitamin D prior to next visit.  She does take a supplement daily but not sure of the strength.   -Patient is advised to maintain close follow up with Nicole Chimera, MD for primary care needs.     I spent  22  minutes in the care of the patient today including review of labs from Thyroid Function, CMP, and other relevant labs ; imaging/biopsy records (current and previous including abstractions from other facilities); face-to-face time discussing  her lab results and symptoms, medications doses, her options of short and long term treatment based on the latest standards of care / guidelines;   and documenting the encounter.  Nicole Gomez  participated in  the discussions, expressed understanding, and voiced agreement with the above plans.  All questions were answered to her satisfaction. she is encouraged to contact clinic should she have any questions  or concerns prior to her return visit.  Follow up plan: Return in about 3 months (around 01/07/2024) for Thyroid follow up, Previsit labs.   Thank you for involving me in the care of this pleasant patient, and I will continue to update you with her progress.   Ronny Bacon, [facility] [address] [place], [place] [address] Phone: [phone] Fax: [phone]  10/08/2023, 8:42 AM

## 2023-12-03 LAB — COLOGUARD: COLOGUARD: POSITIVE — AB

## 2024-01-02 LAB — TSH: TSH: 0.083 u[IU]/mL — ABNORMAL LOW (ref 0.450–4.500)

## 2024-01-02 LAB — T4, FREE: Free T4: 1.32 ng/dL (ref 0.82–1.77)

## 2024-01-02 LAB — VITAMIN D 25 HYDROXY (VIT D DEFICIENCY, FRACTURES): Vit D, 25-Hydroxy: 32 ng/mL (ref 30.0–100.0)

## 2024-01-08 ENCOUNTER — Encounter: Payer: Self-pay | Admitting: Nurse Practitioner

## 2024-01-08 ENCOUNTER — Ambulatory Visit: Admitting: Nurse Practitioner

## 2024-01-08 VITALS — BP 110/74 | HR 78 | Ht 65.0 in | Wt 337.2 lb

## 2024-01-08 DIAGNOSIS — E890 Postprocedural hypothyroidism: Secondary | ICD-10-CM | POA: Diagnosis not present

## 2024-01-08 MED ORDER — LEVOTHYROXINE SODIUM 137 MCG PO TABS: 137.0000 ug | ORAL_TABLET | Freq: Every day | ORAL | 1 refills | Status: DC

## 2024-01-08 NOTE — Progress Notes (Signed)
 01/08/2024     Endocrinology Follow Up Note    Subjective:    Patient ID: Nicole Gomez, female    DOB: [birth year], PCP Toribio Jerel MATSU, MD.   Past Medical History:  Diagnosis Date   Anemia    Asthma    Endometrial disorder     Past Surgical History:  Procedure Laterality Date   ABDOMINAL HYSTERECTOMY     Many years ago , [facility] [place]   CHOLECYSTECTOMY     GALLBLADDER SURGERY     many years ago at [facility] in [place]    Social History   Socioeconomic History   Marital status: Single    Spouse name: Not on file   Number of children: Not on file   Years of education: Not on file   Highest education level: Not on file  Occupational History   Not on file  Tobacco Use   Smoking status: Never   Smokeless tobacco: Never  Vaping Use   Vaping status: Never Used  Substance and Sexual Activity   Alcohol use: No   Drug use: No   Sexual activity: Not on file  Other Topics Concern   Not on file  Social History Narrative   Not on file   Social Drivers of Health   Financial Resource Strain: Not on file  Food Insecurity: Not on file  Transportation Needs: Not on file  Physical Activity: Not on file  Stress: Not on file  Social Connections: Not on file    Family History  Problem Relation Age of Onset   Hypertension Mother    Diabetes Mother    Hypertension Father    Diabetes Father    High Cholesterol Father     Outpatient Encounter Medications as of 01/08/2024  Medication Sig   albuterol  (PROVENTIL  HFA;VENTOLIN  HFA) 108 (90 BASE) MCG/ACT inhaler Inhale 1-2 puffs into the lungs every 4 (four) hours as needed for wheezing or shortness of breath.   albuterol  (PROVENTIL ) (2.5 MG/3ML) 0.083% nebulizer solution Take 2.5 mg by nebulization every 6 (six) hours as needed for wheezing or shortness of breath.   Ascorbic Acid (VITAMIN C) 500 MG CAPS Take 500 mg by mouth daily.   Cholecalciferol (VITAMIN D-3 PO) Take by mouth daily.    CRANBERRY PO Take by mouth daily.   [DISCONTINUED] levothyroxine  (SYNTHROID ) 137 MCG tablet Take 1 tablet (137 mcg total) by mouth daily before breakfast.   levothyroxine  (SYNTHROID ) 137 MCG tablet Take 1 tablet (137 mcg total) by mouth daily before breakfast.   sertraline (ZOLOFT) 100 MG tablet Take 100 mg by mouth daily.   No facility-administered encounter medications on file as of 01/08/2024.    ALLERGIES: No Known Allergies  VACCINATION STATUS: Immunization History  Administered Date(s) Administered   Tdap 07/30/2020     HPI  TAWNI MELKONIAN is 53 y.o. female who presents today with a medical history as above. she is being seen in follow up after being seen in consultation for hyperthyroidism requested by Toribio Jerel MATSU, MD.  she has been dealing with symptoms of tremors, anxiety, and palpitations for several months. These symptoms are progressively worsening and troubling to her.  her most recent thyroid  labs revealed suppressed TSH of 0.010 and T4 of 6.5, T3 uptake of 21, Free Thyroxine index of 1.4 on 03/26/22.  Her PCP did send her for uptake and scan on 04/17/22 which shows hot nodule in right thyroid  lobe and decreased uptake in other areas,  consistent with toxic thyroid  adenoma.  she denies dysphagia, choking, shortness of breath, no recent voice change.    she denies family history of thyroid  dysfunction and denies family hx of thyroid  cancer. she denies personal history of goiter. she is not on any anti-thyroid  medications nor on any thyroid  hormone supplements. Denies use of Biotin containing supplements (she does take a womens MVI).  she is willing to proceed with appropriate work up and therapy for thyrotoxicosis.   She is S/P RAI ablation for hyperthyroidism from Graves disease on 05/21/22.   Review of systems  Constitutional: + Minimally fluctuating body weight,  current Body mass index is 56.11 kg/m. , no fatigue, no subjective hyperthermia, no subjective  hypothermia Eyes: no blurry vision, no xerophthalmia ENT: no sore throat, no nodules palpated in throat, no dysphagia/odynophagia, no hoarseness Cardiovascular: no chest pain, no shortness of breath, no palpitations, no leg swelling Respiratory: no cough, no shortness of breath Gastrointestinal: no nausea/vomiting/diarrhea Musculoskeletal: Left elbow pain-radiates down to wrist Skin: no rashes, no hyperemia Neurological: no tremors, no numbness, no tingling, no dizziness Psychiatric: no depression, no anxiety   Objective:    BP 110/74 (BP Location: Right Arm, Patient Position: Sitting, Cuff Size: Large)   Pulse 78   Ht 5' 5 (1.651 m)   Wt (!) 337 lb 3.2 oz (153 kg)   LMP 03/05/2014   BMI 56.11 kg/m   Wt Readings from Last 3 Encounters:  01/08/24 (!) 337 lb 3.2 oz (153 kg)  10/08/23 (!) 337 lb 12.8 oz (153.2 kg)  07/10/23 (!) 333 lb 9.6 oz (151.3 kg)     BP Readings from Last 3 Encounters:  01/08/24 110/74  10/08/23 130/80  07/10/23 (!) 141/72     Physical Exam- Limited  Constitutional:  Body mass index is 56.11 kg/m. , not in acute distress, normal state of mind Eyes:  EOMI, no exophthalmos Musculoskeletal: no gross deformities, strength intact in all four extremities, no gross restriction of joint movements Skin:  no rashes, no hyperemia Neurological: no tremor with outstretched hands   CMP     Component Value Date/Time   NA 138 05/01/2020 1756   K 3.5 05/01/2020 1756   CL 105 05/01/2020 1756   CO2 21 (L) 05/01/2020 1756   GLUCOSE 123 (H) 05/01/2020 1756   BUN 7 05/01/2020 1756   CREATININE 0.78 05/01/2020 1756   CALCIUM 9.2 05/01/2020 1756   GFRNONAA >60 05/01/2020 1756     CBC    Component Value Date/Time   WBC 14.3 (H) 05/01/2020 1756   RBC 4.65 05/01/2020 1756   HGB 13.0 05/01/2020 1756   HCT 40.4 05/01/2020 1756   PLT 407 (H) 05/01/2020 1756   MCV 86.9 05/01/2020 1756   MCH 28.0 05/01/2020 1756   MCHC 32.2 05/01/2020 1756   RDW 14.1  05/01/2020 1756   LYMPHSABS 3.4 05/01/2020 1756   MONOABS 1.3 (H) 05/01/2020 1756   EOSABS 0.1 05/01/2020 1756   BASOSABS 0.1 05/01/2020 1756     Diabetic Labs (most recent): No results found for: HGBA1C, MICROALBUR  Lipid Panel  No results found for: CHOL, TRIG, HDL, CHOLHDL, VLDL, LDLCALC, LDLDIRECT, LABVLDL   Lab Results  Component Value Date   TSH 0.083 (L) 01/01/2024   TSH 1.300 10/01/2023   TSH 1.920 06/30/2023   TSH 2.990 03/26/2023   TSH 1.250 12/25/2022   TSH 8.420 (H) 09/24/2022   TSH 0.111 (L) 07/10/2022   TSH 0.01 (A) 03/26/2022   FREET4 1.32 01/01/2024  FREET4 1.16 10/01/2023   FREET4 1.01 06/30/2023   FREET4 0.93 03/26/2023   FREET4 0.93 12/25/2022   FREET4 0.65 (L) 09/24/2022   FREET4 0.71 (L) 07/10/2022     Uptake and Scan from 04/17/22 CLINICAL DATA:  Hyperthyroidism   EXAM: THYROID  SCAN AND UPTAKE - 4 AND 24 HOURS   TECHNIQUE: Following oral administration of I-123 capsule, anterior planar imaging was acquired at 24 hours. Thyroid  uptake was calculated with a thyroid  probe at 4-6 hours and 24 hours.   RADIOPHARMACEUTICALS:  315 uCi I-123 sodium iodide p.o.   COMPARISON:  None Available.   FINDINGS: Large hot nodule in RIGHT thyroid  lobe with suppression of uptake in remaining thyroid  tissue consistent with a toxic RIGHT thyroid  adenoma.   No cold nodule identified.   4 hour I-123 uptake = 13.3% (normal 5-20%)   24 hour I-123 uptake = 13.1% (normal 10-30%)   IMPRESSION: Large hot nodule RIGHT thyroid  lobe with suppression of uptake in remaining thyroid  tissue consistent with toxic adenoma.     Electronically Signed   By: Oneil Kiss M.D.   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  Her previsit thyroid  function tests are consistent with appropriate hormone replacement (TSH slightly suppressed but Free T4 is normal and she denies any symptoms of over-replacement).  She is advised to continue her Levothyroxine   137 mcg po daily before breakfast.  Will recheck TFTs prior to next visit and adjust dose accordingly.   - The correct intake of thyroid  hormone (Levothyroxine , Synthroid ), is on empty stomach first thing in the morning, with water, separated by at least 30 minutes from breakfast and other medications,  and separated by more than 4 hours from calcium, iron, multivitamins, acid reflux medications (PPIs).  - This medication is a life-long medication and will be needed to correct thyroid  hormone imbalances for the rest of your life.  The dose may change from time to time, based on thyroid  blood work.  - It is extremely important to be consistent taking this medication, near the same time each morning.  -AVOID TAKING PRODUCTS CONTAINING BIOTIN (commonly found in Hair, Skin, Nails vitamins) AS IT INTERFERES WITH THE VALIDITY OF THYROID  FUNCTION BLOOD TESTS.  -due to chronic fatigue, will also check Vitamin D prior to next visit.  She does take a supplement daily but not sure of the strength.   -Patient is advised to maintain close follow up with Toribio Jerel MATSU, MD for primary care needs.    I spent  22  minutes in the care of the patient today including review of labs from Thyroid  Function, CMP, and other relevant labs ; imaging/biopsy records (current and previous including abstractions from other facilities); face-to-face time discussing  her lab results and symptoms, medications doses, her options of short and long term treatment based on the latest standards of care / guidelines;   and documenting the  encounter.  Nicole Gomez  participated in the discussions, expressed understanding, and voiced agreement with the above plans.  All questions were answered to her satisfaction. she is encouraged to contact clinic should she have any  questions or concerns prior to her return visit.  Follow up plan: Return in about 4 months (around 05/10/2024) for Thyroid  follow up, Previsit labs.   Thank you for involving me in the care of this pleasant patient, and I will continue to update you with her progress.   Benton Rio, [facility] [address] [place], [place] [address] Phone: [phone] Fax: [phone]  01/08/2024, 8:28 AM

## 2024-01-08 NOTE — Patient Instructions (Signed)

## 2024-05-05 LAB — TSH: TSH: 0.108 u[IU]/mL — ABNORMAL LOW (ref 0.450–4.500)

## 2024-05-05 LAB — T4, FREE: Free T4: 1.25 ng/dL (ref 0.82–1.77)

## 2024-05-07 NOTE — Patient Instructions (Signed)

## 2024-05-11 ENCOUNTER — Encounter: Payer: Self-pay | Admitting: Nurse Practitioner

## 2024-05-11 ENCOUNTER — Ambulatory Visit: Admitting: Nurse Practitioner

## 2024-05-11 VITALS — BP 104/68 | HR 65 | Ht 65.0 in | Wt 329.2 lb

## 2024-05-11 DIAGNOSIS — E890 Postprocedural hypothyroidism: Secondary | ICD-10-CM | POA: Diagnosis not present

## 2024-05-11 MED ORDER — LEVOTHYROXINE SODIUM 137 MCG PO TABS: 137.0000 ug | ORAL_TABLET | Freq: Every day | ORAL | 1 refills | Status: AC

## 2024-05-11 NOTE — Progress Notes (Signed)
 05/11/2024     Endocrinology Follow Up Note    Subjective:    Patient ID: Nicole Gomez, female    DOB: February 26, 1971, PCP Toribio Jerel MATSU, MD.   Past Medical History:  Diagnosis Date   Anemia    Asthma    Endometrial disorder     Past Surgical History:  Procedure Laterality Date   ABDOMINAL HYSTERECTOMY     Many years ago , [facility] [place]   CHOLECYSTECTOMY     GALLBLADDER SURGERY     many years ago at [facility] in [place]    Social History   Socioeconomic History   Marital status: Single    Spouse name: Not on file   Number of children: Not on file   Years of education: Not on file   Highest education level: Not on file  Occupational History   Not on file  Tobacco Use   Smoking status: Never   Smokeless tobacco: Never  Vaping Use   Vaping status: Never Used  Substance and Sexual Activity   Alcohol use: No   Drug use: No   Sexual activity: Not on file  Other Topics Concern   Not on file  Social History Narrative   Not on file   Social Drivers of Health   Financial Resource Strain: Not on file  Food Insecurity: Not on file  Transportation Needs: Not on file  Physical Activity: Not on file  Stress: Not on file  Social Connections: Not on file    Family History  Problem Relation Age of Onset   Hypertension Mother    Diabetes Mother    Hypertension Father    Diabetes Father    High Cholesterol Father     Outpatient Encounter Medications as of 05/11/2024  Medication Sig   albuterol  (PROVENTIL  HFA;VENTOLIN  HFA) 108 (90 BASE) MCG/ACT inhaler Inhale 1-2 puffs into the lungs every 4 (four) hours as needed for wheezing or shortness of breath.   albuterol  (PROVENTIL ) (2.5 MG/3ML) 0.083% nebulizer solution Take 2.5 mg by nebulization every 6 (six) hours as needed for wheezing or shortness of breath.   Ascorbic Acid (VITAMIN C) 500 MG CAPS Take 500 mg by mouth daily.   Cholecalciferol (VITAMIN D -3 PO) Take by mouth daily.    CRANBERRY PO Take by mouth daily.   sertraline (ZOLOFT) 100 MG tablet Take 100 mg by mouth daily.   [DISCONTINUED] levothyroxine  (SYNTHROID ) 137 MCG tablet Take 1 tablet (137 mcg total) by mouth daily before breakfast.   levothyroxine  (SYNTHROID ) 137 MCG tablet Take 1 tablet (137 mcg total) by mouth daily before breakfast.   No facility-administered encounter medications on file as of 05/11/2024.    ALLERGIES: No Known Allergies  VACCINATION STATUS: Immunization History  Administered Date(s) Administered   Tdap 07/30/2020     HPI  Nicole Gomez is 53 y.o. female who presents today with a medical history as above. she is being seen in follow up after being seen in consultation for hyperthyroidism requested by Toribio Jerel MATSU, MD.  she has been dealing with symptoms of tremors, anxiety, and palpitations for several months. These symptoms are progressively worsening and troubling to her.  her most recent thyroid  labs revealed suppressed TSH of 0.010 and T4 of 6.5, T3 uptake of 21, Free Thyroxine index of 1.4 on 03/26/22.  Her PCP did send her for uptake and scan on 04/17/22 which shows hot nodule in right thyroid  lobe and decreased uptake in other areas,  consistent with toxic thyroid  adenoma.  she denies dysphagia, choking, shortness of breath, no recent voice change.    she denies family history of thyroid  dysfunction and denies family hx of thyroid  cancer. she denies personal history of goiter. she is not on any anti-thyroid  medications nor on any thyroid  hormone supplements. Denies use of Biotin containing supplements (she does take a womens MVI).  she is willing to proceed with appropriate work up and therapy for thyrotoxicosis.   She is S/P RAI ablation for hyperthyroidism from Graves disease on 05/21/22.   Review of systems  Constitutional: + decreasing body weight-working on healthy diet changes,  current Body mass index is 54.78 kg/m. , no fatigue, no subjective hyperthermia,  no subjective hypothermia Eyes: no blurry vision, no xerophthalmia ENT: no sore throat, no nodules palpated in throat, no dysphagia/odynophagia, no hoarseness Cardiovascular: no chest pain, no shortness of breath, no palpitations, no leg swelling Respiratory: no cough, no shortness of breath Gastrointestinal: no nausea/vomiting/diarrhea Musculoskeletal: no muscle/joint aches Skin: no rashes, no hyperemia Neurological: no tremors, no numbness, no tingling, no dizziness Psychiatric: no depression, no anxiety   Objective:    BP 104/68 (BP Location: Left Arm, Patient Position: Sitting, Cuff Size: Large)   Pulse 65   Ht 5' 5 (1.651 m)   Wt (!) 329 lb 3.2 oz (149.3 kg)   LMP 03/05/2014   BMI 54.78 kg/m   Wt Readings from Last 3 Encounters:  05/11/24 (!) 329 lb 3.2 oz (149.3 kg)  01/08/24 (!) 337 lb 3.2 oz (153 kg)  10/08/23 (!) 337 lb 12.8 oz (153.2 kg)     BP Readings from Last 3 Encounters:  05/11/24 104/68  01/08/24 110/74  10/08/23 130/80     Physical Exam- Limited  Constitutional:  Body mass index is 54.78 kg/m. , not in acute distress, normal state of mind Eyes:  EOMI, no exophthalmos Musculoskeletal: no gross deformities, strength intact in all four extremities, no gross restriction of joint movements Skin:  no rashes, no hyperemia Neurological: no tremor with outstretched hands   CMP     Component Value Date/Time   NA 138 05/01/2020 1756   K 3.5 05/01/2020 1756   CL 105 05/01/2020 1756   CO2 21 (L) 05/01/2020 1756   GLUCOSE 123 (H) 05/01/2020 1756   BUN 7 05/01/2020 1756   CREATININE 0.78 05/01/2020 1756   CALCIUM 9.2 05/01/2020 1756   GFRNONAA >60 05/01/2020 1756     CBC    Component Value Date/Time   WBC 14.3 (H) 05/01/2020 1756   RBC 4.65 05/01/2020 1756   HGB 13.0 05/01/2020 1756   HCT 40.4 05/01/2020 1756   PLT 407 (H) 05/01/2020 1756   MCV 86.9 05/01/2020 1756   MCH 28.0 05/01/2020 1756   MCHC 32.2 05/01/2020 1756   RDW 14.1 05/01/2020  1756   LYMPHSABS 3.4 05/01/2020 1756   MONOABS 1.3 (H) 05/01/2020 1756   EOSABS 0.1 05/01/2020 1756   BASOSABS 0.1 05/01/2020 1756     Diabetic Labs (most recent): No results found for: HGBA1C, MICROALBUR  Lipid Panel  No results found for: CHOL, TRIG, HDL, CHOLHDL, VLDL, LDLCALC, LDLDIRECT, LABVLDL   Lab Results  Component Value Date   TSH 0.108 (L) 05/04/2024   TSH 0.083 (L) 01/01/2024   TSH 1.300 10/01/2023   TSH 1.920 06/30/2023   TSH 2.990 03/26/2023   TSH 1.250 12/25/2022   TSH 8.420 (H) 09/24/2022   TSH 0.111 (L) 07/10/2022   TSH 0.01 (A) 03/26/2022  FREET4 1.25 05/04/2024   FREET4 1.32 01/01/2024   FREET4 1.16 10/01/2023   FREET4 1.01 06/30/2023   FREET4 0.93 03/26/2023   FREET4 0.93 12/25/2022   FREET4 0.65 (L) 09/24/2022   FREET4 0.71 (L) 07/10/2022     Uptake and Scan from 04/17/22 CLINICAL DATA:  Hyperthyroidism   EXAM: THYROID  SCAN AND UPTAKE - 4 AND 24 HOURS   TECHNIQUE: Following oral administration of I-123 capsule, anterior planar imaging was acquired at 24 hours. Thyroid  uptake was calculated with a thyroid  probe at 4-6 hours and 24 hours.   RADIOPHARMACEUTICALS:  315 uCi I-123 sodium iodide p.o.   COMPARISON:  None Available.   FINDINGS: Large hot nodule in RIGHT thyroid  lobe with suppression of uptake in remaining thyroid  tissue consistent with a toxic RIGHT thyroid  adenoma.   No cold nodule identified.   4 hour I-123 uptake = 13.3% (normal 5-20%)   24 hour I-123 uptake = 13.1% (normal 10-30%)   IMPRESSION: Large hot nodule RIGHT thyroid  lobe with suppression of uptake in remaining thyroid  tissue consistent with toxic adenoma.     Electronically Signed   By: Oneil Kiss M.D.   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  Her previsit thyroid  function tests are consistent with appropriate hormone replacement (TSH slightly suppressed but Free T4 is normal and she denies any symptoms of over-replacement).  She is advised to continue her Levothyroxine   137 mcg po daily before breakfast.  Will recheck TFTs prior to next visit and adjust dose accordingly.   - The correct intake of thyroid  hormone (Levothyroxine , Synthroid ), is on empty stomach first thing in the morning, with water, separated by at least 30 minutes from breakfast and other medications,  and separated by more than 4 hours from calcium, iron, multivitamins, acid reflux medications (PPIs).  - This medication is a life-long medication and will be needed to correct thyroid  hormone imbalances for the rest of your life.  The dose may change from time to time, based on thyroid  blood work.  - It is extremely important to be consistent taking this medication, near the same time each morning.  -AVOID TAKING PRODUCTS CONTAINING BIOTIN (commonly found in Hair, Skin, Nails vitamins) AS IT INTERFERES WITH THE VALIDITY OF THYROID  FUNCTION BLOOD TESTS.    -Patient is advised to maintain close follow up with Toribio Jerel MATSU, MD for primary care needs.   I spent  28  minutes in the care of the patient today including review of labs from Thyroid  Function, CMP, and other relevant labs ; imaging/biopsy records (current and previous including abstractions from other facilities); face-to-face time discussing  her lab results and symptoms, medications doses, her options of short and long term treatment based on the latest standards of care / guidelines;   and documenting the encounter.  Nicole Gomez  participated in the discussions, expressed understanding, and voiced agreement with the above plans.   All questions were answered to her satisfaction. she is encouraged to contact clinic should she have any questions or concerns prior to her return visit.  Follow up plan: Return in about 6 months (around 11/08/2024) for  Thyroid  follow up, Previsit labs.   Thank you for involving me in the care of this pleasant patient, and I will continue to update you with her progress.   Benton Rio, [facility] [address] [place], [place] [address] Phone: [phone] Fax: [phone]  05/11/2024, 8:24 AM

## 2024-11-08 ENCOUNTER — Ambulatory Visit: Admitting: Nurse Practitioner
